# Patient Record
Sex: Male | Born: 1980
Health system: Southern US, Community
[De-identification: ages and names within clinical notes are randomized; demographics above are authoritative.]

## PROBLEM LIST (undated history)

## (undated) DIAGNOSIS — F319 Bipolar disorder, unspecified: Secondary | ICD-10-CM

---
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
## 2000-03-23 ENCOUNTER — Emergency Department (HOSPITAL_COMMUNITY): Admission: EM | Admit: 2000-03-23 | Discharge: 2000-03-23 | Payer: Self-pay

## 2000-11-02 ENCOUNTER — Emergency Department (HOSPITAL_COMMUNITY): Admission: EM | Admit: 2000-11-02 | Discharge: 2000-11-02 | Payer: Self-pay | Admitting: Emergency Medicine

## 2002-08-19 ENCOUNTER — Emergency Department (HOSPITAL_COMMUNITY): Admission: EM | Admit: 2002-08-19 | Discharge: 2002-08-19 | Payer: Self-pay | Admitting: Emergency Medicine

## 2002-08-19 ENCOUNTER — Encounter: Payer: Self-pay | Admitting: Emergency Medicine

## 2002-12-24 ENCOUNTER — Encounter: Payer: Self-pay | Admitting: Emergency Medicine

## 2002-12-24 ENCOUNTER — Emergency Department (HOSPITAL_COMMUNITY): Admission: EM | Admit: 2002-12-24 | Discharge: 2002-12-24 | Payer: Self-pay | Admitting: Emergency Medicine

## 2002-12-26 ENCOUNTER — Emergency Department (HOSPITAL_COMMUNITY): Admission: EM | Admit: 2002-12-26 | Discharge: 2002-12-26 | Payer: Self-pay | Admitting: Emergency Medicine

## 2003-10-08 ENCOUNTER — Emergency Department (HOSPITAL_COMMUNITY): Admission: EM | Admit: 2003-10-08 | Discharge: 2003-10-08 | Payer: Self-pay | Admitting: Emergency Medicine

## 2004-02-14 ENCOUNTER — Encounter: Admission: RE | Admit: 2004-02-14 | Discharge: 2004-02-14 | Payer: Self-pay | Admitting: Internal Medicine

## 2004-04-15 ENCOUNTER — Ambulatory Visit: Payer: Self-pay | Admitting: Internal Medicine

## 2004-07-17 ENCOUNTER — Emergency Department (HOSPITAL_COMMUNITY): Admission: EM | Admit: 2004-07-17 | Discharge: 2004-07-17 | Payer: Self-pay | Admitting: Emergency Medicine

## 2005-02-15 ENCOUNTER — Ambulatory Visit: Payer: Self-pay | Admitting: Psychiatry

## 2005-02-15 ENCOUNTER — Inpatient Hospital Stay (HOSPITAL_COMMUNITY): Admission: RE | Admit: 2005-02-15 | Discharge: 2005-02-17 | Payer: Self-pay | Admitting: Psychiatry

## 2005-04-08 ENCOUNTER — Emergency Department (HOSPITAL_COMMUNITY): Admission: EM | Admit: 2005-04-08 | Discharge: 2005-04-08 | Payer: Self-pay | Admitting: *Deleted

## 2005-04-14 ENCOUNTER — Emergency Department (HOSPITAL_COMMUNITY): Admission: EM | Admit: 2005-04-14 | Discharge: 2005-04-14 | Payer: Self-pay | Admitting: Emergency Medicine

## 2005-09-21 ENCOUNTER — Emergency Department (HOSPITAL_COMMUNITY): Admission: EM | Admit: 2005-09-21 | Discharge: 2005-09-21 | Payer: Self-pay | Admitting: Emergency Medicine

## 2007-11-23 ENCOUNTER — Emergency Department (HOSPITAL_COMMUNITY): Admission: EM | Admit: 2007-11-23 | Discharge: 2007-11-23 | Payer: Self-pay | Admitting: Emergency Medicine

## 2009-10-25 ENCOUNTER — Emergency Department (HOSPITAL_COMMUNITY): Admission: EM | Admit: 2009-10-25 | Discharge: 2009-10-25 | Payer: Self-pay | Admitting: Family Medicine

## 2009-11-18 ENCOUNTER — Emergency Department (HOSPITAL_COMMUNITY): Admission: EM | Admit: 2009-11-18 | Discharge: 2009-11-18 | Payer: Self-pay | Admitting: Family Medicine

## 2010-06-16 ENCOUNTER — Emergency Department (HOSPITAL_COMMUNITY)
Admission: EM | Admit: 2010-06-16 | Discharge: 2010-06-16 | Payer: Self-pay | Source: Home / Self Care | Admitting: Emergency Medicine

## 2010-07-10 ENCOUNTER — Emergency Department (HOSPITAL_BASED_OUTPATIENT_CLINIC_OR_DEPARTMENT_OTHER)
Admission: EM | Admit: 2010-07-10 | Discharge: 2010-07-10 | Payer: Self-pay | Source: Home / Self Care | Admitting: Emergency Medicine

## 2010-07-15 ENCOUNTER — Ambulatory Visit
Admission: RE | Admit: 2010-07-15 | Discharge: 2010-07-15 | Payer: Self-pay | Source: Home / Self Care | Attending: Family Medicine | Admitting: Family Medicine

## 2010-07-15 DIAGNOSIS — M542 Cervicalgia: Secondary | ICD-10-CM | POA: Insufficient documentation

## 2010-07-15 DIAGNOSIS — M549 Dorsalgia, unspecified: Secondary | ICD-10-CM | POA: Insufficient documentation

## 2010-07-29 ENCOUNTER — Inpatient Hospital Stay (INDEPENDENT_AMBULATORY_CARE_PROVIDER_SITE_OTHER)
Admission: RE | Admit: 2010-07-29 | Discharge: 2010-07-29 | Disposition: A | Discharge status: Home / Self Care | Payer: No Typology Code available for payment source | Source: Ambulatory Visit

## 2010-07-29 ENCOUNTER — Ambulatory Visit (HOSPITAL_COMMUNITY): Payer: Self-pay

## 2010-07-29 ENCOUNTER — Other Ambulatory Visit: Payer: Self-pay | Admitting: Emergency Medicine

## 2010-07-29 DIAGNOSIS — R319 Hematuria, unspecified: Secondary | ICD-10-CM

## 2010-07-30 LAB — POCT URINALYSIS DIPSTICK
Bilirubin Urine: NEGATIVE
Specific Gravity, Urine: 1.025 (ref 1.005–1.030)
pH: 7 (ref 5.0–8.0)
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
## 2010-08-14 ENCOUNTER — Ambulatory Visit: Payer: No Typology Code available for payment source | Attending: Family Medicine | Admitting: Physical Therapy

## 2010-08-14 DIAGNOSIS — M2550 Pain in unspecified joint: Secondary | ICD-10-CM | POA: Insufficient documentation

## 2010-08-14 DIAGNOSIS — M2560 Stiffness of unspecified joint, not elsewhere classified: Secondary | ICD-10-CM | POA: Insufficient documentation

## 2010-08-14 DIAGNOSIS — IMO0001 Reserved for inherently not codable concepts without codable children: Secondary | ICD-10-CM | POA: Insufficient documentation

## 2010-08-20 ENCOUNTER — Ambulatory Visit: Payer: No Typology Code available for payment source | Admitting: Physical Therapy

## 2010-08-21 ENCOUNTER — Inpatient Hospital Stay (INDEPENDENT_AMBULATORY_CARE_PROVIDER_SITE_OTHER)
Admission: RE | Admit: 2010-08-21 | Discharge: 2010-08-21 | Disposition: A | Discharge status: Home / Self Care | Payer: Self-pay | Source: Ambulatory Visit | Attending: Family Medicine | Admitting: Family Medicine

## 2010-08-21 DIAGNOSIS — R300 Dysuria: Secondary | ICD-10-CM

## 2010-08-21 LAB — POCT URINALYSIS DIPSTICK
Hgb urine dipstick: NEGATIVE
Ketones, ur: NEGATIVE mg/dL
Protein, ur: NEGATIVE mg/dL
Specific Gravity, Urine: 1.025 (ref 1.005–1.030)
pH: 6 (ref 5.0–8.0)

## 2010-08-22 LAB — GC/CHLAMYDIA PROBE AMP, GENITAL: GC Probe Amp, Genital: NEGATIVE

## 2010-08-24 ENCOUNTER — Ambulatory Visit: Payer: No Typology Code available for payment source | Admitting: Rehabilitation

## 2010-08-27 ENCOUNTER — Encounter: Payer: Self-pay | Admitting: Rehabilitation

## 2010-09-07 ENCOUNTER — Ambulatory Visit: Payer: No Typology Code available for payment source | Attending: Family Medicine | Admitting: Rehabilitation

## 2010-09-10 ENCOUNTER — Ambulatory Visit: Payer: No Typology Code available for payment source | Admitting: Physical Therapy

## 2010-09-14 LAB — GC/CHLAMYDIA PROBE AMP, GENITAL: Chlamydia, DNA Probe: NEGATIVE

## 2010-09-14 LAB — POCT URINALYSIS DIP (DEVICE)
Bilirubin Urine: NEGATIVE
Glucose, UA: NEGATIVE mg/dL
Specific Gravity, Urine: 1.025 (ref 1.005–1.030)
Urobilinogen, UA: 0.2 mg/dL (ref 0.0–1.0)

## 2010-09-15 LAB — WOUND CULTURE

## 2010-09-28 LAB — POCT I-STAT, CHEM 8
Calcium, Ion: 1.19 mmol/L (ref 1.12–1.32)
HCT: 47 % (ref 39.0–52.0)
TCO2: 25 mmol/L (ref 0–100)

## 2010-09-28 LAB — POCT URINALYSIS DIP (DEVICE)
Glucose, UA: NEGATIVE mg/dL
Nitrite: NEGATIVE
Protein, ur: NEGATIVE mg/dL
Urobilinogen, UA: 0.2 mg/dL (ref 0.0–1.0)

## 2010-09-28 LAB — GC/CHLAMYDIA PROBE AMP, GENITAL
Chlamydia, DNA Probe: NEGATIVE
GC Probe Amp, Genital: NEGATIVE
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
## 2011-03-24 LAB — URINALYSIS, ROUTINE W REFLEX MICROSCOPIC
Hgb urine dipstick: NEGATIVE
Nitrite: NEGATIVE
pH: 6

## 2011-03-24 LAB — RPR: RPR Ser Ql: NONREACTIVE

## 2011-03-31 ENCOUNTER — Inpatient Hospital Stay (INDEPENDENT_AMBULATORY_CARE_PROVIDER_SITE_OTHER)
Admission: RE | Admit: 2011-03-31 | Discharge: 2011-03-31 | Disposition: A | Discharge status: Home / Self Care | Payer: Self-pay | Source: Ambulatory Visit | Attending: Family Medicine | Admitting: Family Medicine

## 2011-03-31 DIAGNOSIS — N342 Other urethritis: Secondary | ICD-10-CM

## 2011-04-01 LAB — GC/CHLAMYDIA PROBE AMP, GENITAL: Chlamydia, DNA Probe: NEGATIVE

## 2011-04-07 ENCOUNTER — Emergency Department (HOSPITAL_BASED_OUTPATIENT_CLINIC_OR_DEPARTMENT_OTHER)
Admission: EM | Admit: 2011-04-07 | Discharge: 2011-04-07 | Disposition: A | Discharge status: Home / Self Care | Payer: Self-pay | Attending: Emergency Medicine | Admitting: Emergency Medicine

## 2011-04-07 ENCOUNTER — Emergency Department (INDEPENDENT_AMBULATORY_CARE_PROVIDER_SITE_OTHER): Payer: Self-pay

## 2011-04-07 ENCOUNTER — Encounter: Payer: Self-pay | Admitting: *Deleted

## 2011-04-07 DIAGNOSIS — M25519 Pain in unspecified shoulder: Secondary | ICD-10-CM

## 2011-04-07 DIAGNOSIS — R42 Dizziness and giddiness: Secondary | ICD-10-CM | POA: Insufficient documentation

## 2011-04-07 DIAGNOSIS — F17200 Nicotine dependence, unspecified, uncomplicated: Secondary | ICD-10-CM | POA: Insufficient documentation

## 2011-04-07 DIAGNOSIS — F319 Bipolar disorder, unspecified: Secondary | ICD-10-CM | POA: Insufficient documentation

## 2011-04-07 HISTORY — DX: Bipolar disorder, unspecified: F31.9

## 2011-04-07 LAB — CBC
Hemoglobin: 14.4 g/dL (ref 13.0–17.0)
MCH: 29.9 pg (ref 26.0–34.0)
Platelets: 222 10*3/uL (ref 150–400)
RBC: 4.82 MIL/uL (ref 4.22–5.81)
WBC: 6.7 10*3/uL (ref 4.0–10.5)

## 2011-04-07 LAB — BASIC METABOLIC PANEL
Calcium: 9.2 mg/dL (ref 8.4–10.5)
GFR calc Af Amer: >90 mL/min (ref >90)
GFR calc non Af Amer: 89 mL/min — ABNORMAL LOW (ref >90)
Glucose, Bld: 99 mg/dL (ref 70–99)
Potassium: 3.8 mEq/L (ref 3.5–5.1)
Sodium: 138 mEq/L (ref 135–145)

## 2011-04-07 MED ORDER — MECLIZINE HCL 25 MG PO TABS
25.0000 mg | ORAL_TABLET | Freq: Once | ORAL | Status: AC
  Administered 2011-04-07: 25 mg via ORAL
  Filled 2011-04-07: qty 1

## 2011-04-07 NOTE — ED Provider Notes (Signed)
History     CSN: [id number] Arrival date & time: 04/07/2011  1:36 PM  Chief Complaint  Patient presents with  . Dizziness    x 2 days    (Consider location/radiation/quality/duration/timing/severity/associated sxs/prior treatment) The history is provided by the patient.  pt indicates in past 2 days has felt dizzy at times. States is neither room spinning sensation, nor faint feeling, 'just dizzy'. No nv. No exacerbating or alleviating factors. No ear pain or tinnitus. No hearing loss. No asa use. No sinus congestion or uri symptoms. No head pain, although states last pm did have mild headache, not associated w onset dizziness. No problems w balance or coordination. No change in speech or vision. No numbness/weakness. Denies blood loss/melena. No chest pain. No palpitations. No sob. Denies abd pain. No nvd. No gu c/o. Denies any medication use.   Past Medical History  Diagnosis Date  . Bipolar 1 disorder, depressed     Past Surgical History  Procedure Date  . Mandible fracture surgery 1996    Family History  Problem Relation Age of Onset  . Cancer Mother   . Diabetes Mother   . Hypertension Mother     History  Substance Use Topics  . Smoking status: Current Some Day Smoker -- 1.0 packs/day for 10 years    Types: Cigarettes  . Smokeless tobacco: Not on file  . Alcohol Use: No      Review of Systems  Constitutional: Negative for fever and chills.  HENT: Negative for ear pain, neck pain and tinnitus.   Eyes: Negative for pain.  Respiratory: Negative for shortness of breath.   Cardiovascular: Negative for chest pain and palpitations.  Gastrointestinal: Negative for abdominal pain.  Genitourinary: Negative for dysuria.  Musculoskeletal: Negative for back pain.  Skin: Negative for rash.  Neurological: Negative for speech difficulty, weakness and numbness.  Hematological: Does not bruise/bleed easily.    Allergies  Penicillins  Home Medications  No current  outpatient prescriptions on file.  BP 119/70  Pulse 64  Temp(Src) 98.7 F (37.1 C) (Oral)  Resp 20  Ht 5\' 10"  (1.778 m)  Wt 170 lb (77.111 kg)  BMI 24.39 kg/m2  SpO2 99%  Physical Exam  Nursing note and vitals reviewed. Constitutional: He is oriented to person, place, and time. He appears well-developed and well-nourished. No distress.  HENT:  Head: Atraumatic.  Right Ear: External ear normal.  Left Ear: External ear normal.  Mouth/Throat: Oropharynx is clear and moist.  Eyes: EOM are normal. Pupils are equal, round, and reactive to light.  Neck: Normal range of motion. Neck supple. No tracheal deviation present. No thyromegaly present.       No carotid bruit  Cardiovascular: Normal rate, regular rhythm, normal heart sounds and intact distal pulses.  Exam reveals no gallop and no friction rub.   No murmur heard. Pulmonary/Chest: Effort normal and breath sounds normal. No accessory muscle usage. No respiratory distress.  Abdominal: Soft. Bowel sounds are normal. He exhibits no distension. There is no tenderness.  Musculoskeletal: Normal range of motion. He exhibits no edema and no tenderness.  Neurological: He is alert and oriented to person, place, and time.       No nystagmus. No facial droop or asymmetry. Normal/stable gait. No pronator drift.   Skin: Skin is warm and dry. No rash noted.  Psychiatric: He has a normal mood and affect.    ED Course  Procedures (including critical care time)   Labs Reviewed  BASIC METABOLIC PANEL  CBC   No results found.   No diagnosis found.    MDM  Pt also expresses concern over left elbow pain. States for  'years' has sensation of something being stuck in elbow, ?fb.  States cut/injured on broken glass many years ago. Periodically feels sharp pain to olecranon area and feels as if fb, requests xr.         Suzi Roots, MD 04/07/11 [phone]

## 2011-04-07 NOTE — ED Notes (Signed)
Patient states he woke up 2 days ago with dizziness and intermittent nausea.  Denies recent illness or injury.

## 2011-10-05 ENCOUNTER — Encounter (HOSPITAL_BASED_OUTPATIENT_CLINIC_OR_DEPARTMENT_OTHER): Payer: Self-pay | Admitting: Emergency Medicine

## 2011-10-05 ENCOUNTER — Emergency Department (HOSPITAL_BASED_OUTPATIENT_CLINIC_OR_DEPARTMENT_OTHER)
Admission: EM | Admit: 2011-10-05 | Discharge: 2011-10-05 | Disposition: A | Discharge status: Home / Self Care | Payer: Self-pay | Attending: Emergency Medicine | Admitting: Emergency Medicine

## 2011-10-05 DIAGNOSIS — F319 Bipolar disorder, unspecified: Secondary | ICD-10-CM | POA: Insufficient documentation

## 2011-10-05 DIAGNOSIS — F17200 Nicotine dependence, unspecified, uncomplicated: Secondary | ICD-10-CM | POA: Insufficient documentation

## 2011-10-05 DIAGNOSIS — W57XXXA Bitten or stung by nonvenomous insect and other nonvenomous arthropods, initial encounter: Secondary | ICD-10-CM | POA: Insufficient documentation

## 2011-10-05 DIAGNOSIS — T148 Other injury of unspecified body region: Secondary | ICD-10-CM | POA: Insufficient documentation

## 2011-10-05 NOTE — Discharge Instructions (Signed)
Bedbugs Bedbugs are tiny bugs that live in and around beds. During the day, they hide in mattresses and other places near beds. They come out at night and bite people lying in bed. They need blood to live and grow. Bedbugs can be found in beds anywhere. Usually, they are found in places where many people come and go (hotels, shelters, hospitals). It does not matter whether the place is dirty or clean. Getting bitten by bedbugs rarely causes a medical problem. The biggest problem can be getting rid of them. This often takes the work of a pest control expert. CAUSES  Less use of pesticides. Bedbugs were common before the 1950s. Then, strong pesticides such as DDT nearly wiped them out. Today, these pesticides are not used because they harm the environment and can cause health problems.   More travel. Besides mattresses, bedbugs can also live in clothing and luggage. They can come along as people travel from place to place. Bedbugs are more common in certain parts of the world. When people travel to those areas, the bugs can come home with them.   Presence of birds and bats. Bedbugs often infest birds and bats. If you have these animals in or near your home, bedbugs may infest your house, too.  SYMPTOMS It does not hurt to be bitten by a bedbug. You will probably not wake up when you are bitten. Bedbugs usually bite areas of the skin that are not covered. Symptoms may show when you wake up, or they may take a day or more to show up. Symptoms may include:  Small red bumps on the skin. These might be lined up in a row or clustered in a group.   A darker red dot in the middle of red bumps.   Blisters on the skin. There may be swelling and very bad itching. These may be signs of an allergic reaction. This does not happen often.  DIAGNOSIS Bedbug bites might look and feel like other types of insect bites. The bugs do not stay on the body like ticks or lice. They bite, drop off, and crawl away to hide.  Your caregiver will probably:  Ask about your symptoms.   Ask about your recent activities and travel.   Check your skin for bedbug bites.   Ask you to check at home for signs of bedbugs. You should look for:   Spots or stains on the bed or nearby. This could be from bedbugs that were crushed or from their eggs or waste.   Bedbugs themselves. They are reddish-brown, oval, and flat. They do not fly. They are about the size of an apple seed.   Places to look for bedbugs include:   Beds. Check mattresses, headboards, box springs, and bed frames.   On drapes and curtains near the bed.   Under carpeting in the bedroom.   Behind electrical outlets.   Behind any wallpaper that is peeling.   Inside luggage.  TREATMENT Most bedbug bites do not need treatment. They usually go away on their own in a few days. The bites are not dangerous. However, treatment may be needed if you have scratched so much that your skin has become infected. You may also need treatment if you are allergic to bedbug bites. Treatment options include:  A drug that stops swelling and itching (corticosteroid). Usually, a cream is rubbed on the skin. If you have a bad rash, you may be given a corticosteroid pill.   Oral antihistamines. These are   pills to help control itching.   Antibiotic medicines. An antibiotic may be prescribed for infected skin.  HOME CARE INSTRUCTIONS   Take any medicine prescribed by your caregiver for your bites. Follow the directions carefully.   Consider wearing pajamas with long sleeves and pant legs.   Your bedroom may need to be treated. A pest control expert should make sure the bedbugs are gone. You may need to throw away mattresses or luggage. Ask the pest control expert what you can do to keep the bedbugs from coming back. Common suggestions include:   Putting a plastic cover over your mattress.   Washing and drying your clothes and bedding in hot water and a hot dryer. The  temperature should be hotter than 120 F (48.9 C). Bedbugs are killed by high temperatures.   Vacuuming carefully all around your bed. Vacuum in all cracks and crevices where the bugs might hide. Do this often.   Carefully checking all used furniture, bedding, or clothes that you bring into your house.   Eliminating bird nests and bat roosts.   If you get bedbug bites when traveling, check all your possessions carefully before bringing them into your house. If you find any bugs on clothes or in your luggage, consider throwing those items away.  SEEK MEDICAL CARE IF:  You have red bug bites that keep coming back.   You have red bug bites that itch badly.   You have bug bites that cause a skin rash.   You have scratch marks that are red and sore.  SEEK IMMEDIATE MEDICAL CARE IF: You have a fever. Document Released: 07/17/2010 Document Revised: 06/03/2011 Document Reviewed: 07/17/2010 [facility] Patient Information 2012 [place], [place].Insect Bite Mosquitoes, flies, fleas, bedbugs, and many other insects can bite. Insect bites are different from insect stings. A sting is when venom is injected into the skin. Some insect bites can transmit infectious diseases. SYMPTOMS  Insect bites usually turn red, swell, and itch for 2 to 4 days. They often go away on their own. TREATMENT  Your caregiver may prescribe antibiotic medicines if a bacterial infection develops in the bite. HOME CARE INSTRUCTIONS  Do not scratch the bite area.   Keep the bite area clean and dry. Wash the bite area thoroughly with soap and water.   Put ice or cool compresses on the bite area.   Put ice in a plastic bag.   Place a towel between your skin and the bag.   Leave the ice on for 20 minutes, 4 times a day for the first 2 to 3 days, or as directed.   You may apply a baking soda paste, cortisone cream, or calamine lotion to the bite area as directed by your caregiver. This can help reduce itching and swelling.     Only take over-the-counter or prescription medicines as directed by your caregiver.   If you are given antibiotics, take them as directed. Finish them even if you start to feel better.  You may need a tetanus shot if:  You cannot remember when you had your last tetanus shot.   You have never had a tetanus shot.   The injury broke your skin.  If you get a tetanus shot, your arm may swell, get red, and feel warm to the touch. This is common and not a problem. If you need a tetanus shot and you choose not to have one, there is a rare chance of getting tetanus. Sickness from tetanus can be serious. SEEK  IMMEDIATE MEDICAL CARE IF:   You have increased pain, redness, or swelling in the bite area.   You see a red line on the skin coming from the bite.   You have a fever.   You have joint pain.   You have a headache or neck pain.   You have unusual weakness.   You have a rash.   You have chest pain or shortness of breath.   You have abdominal pain, nausea, or vomiting.   You feel unusually tired or sleepy.  MAKE SURE YOU:   Understand these instructions.   Will watch your condition.   Will get help right away if you are not doing well or get worse.  Document Released: 07/22/2004 Document Revised: 06/03/2011 Document Reviewed: 01/13/2011 [facility] Patient Information 2012 [place], [place].

## 2011-10-05 NOTE — ED Notes (Signed)
States woke up yesterday morning with "welts" on my stomach and back

## 2011-10-05 NOTE — ED Provider Notes (Signed)
History     CSN: [id number]  Arrival date & time 10/05/11  1338   First MD Initiated Contact with Patient 10/05/11 1427      Chief Complaint  Patient presents with  . Rash    (Consider location/radiation/quality/duration/timing/severity/associated sxs/prior treatment) Patient is a 31 y.o. male presenting with rash. The history is provided by the patient. No language interpreter was used.  Rash  This is a new problem. The current episode started 2 days ago. The problem has been gradually worsening. The problem is associated with an insect bite/sting. There has been no fever. The rash is present on the torso. The pain is at a severity of 3/10. Associated symptoms include blisters and itching. He has tried nothing for the symptoms. The treatment provided moderate relief. Risk factors: stayed in a hotel.    Past Medical History  Diagnosis Date  . Bipolar 1 disorder, depressed     Past Surgical History  Procedure Date  . Mandible fracture surgery 1996    Family History  Problem Relation Age of Onset  . Cancer Mother   . Diabetes Mother   . Hypertension Mother     History  Substance Use Topics  . Smoking status: Current Some Day Smoker -- 1.0 packs/day for 10 years    Types: Cigarettes  . Smokeless tobacco: Not on file  . Alcohol Use: No      Review of Systems  Skin: Positive for itching and rash.  All other systems reviewed and are negative.    Allergies  Penicillins  Home Medications  No current outpatient prescriptions on file.  BP 120/77  Pulse 72  Temp(Src) 98.1 F (36.7 C) (Oral)  Resp 20  Ht 5\' 10"  (1.778 m)  Wt 185 lb (83.915 kg)  BMI 26.54 kg/m2  SpO2 99%  Physical Exam  Nursing note and vitals reviewed. Constitutional: He is oriented to person, place, and time. He appears well-developed and well-nourished.  HENT:  Head: Normocephalic and atraumatic.  Eyes: Conjunctivae and EOM are normal. Pupils are equal, round, and reactive to light.    Musculoskeletal: Normal range of motion.  Neurological: He is alert and oriented to person, place, and time.  Skin: Rash noted.       Multiple red raised areas with pinpoint center looks like bites  Psychiatric: He has a normal mood and affect.    ED Course  Procedures (including critical care time)  Labs Reviewed - No data to display No results found.   1. Insect bite       MDM  I advised benadryl and topical hydrocortisone cream       Lonia Skinner [place], [place] 10/05/11 1457

## 2011-10-07 NOTE — ED Provider Notes (Signed)
Medical screening examination/treatment/procedure(s) were performed by non-physician practitioner and as supervising physician I was immediately available for consultation/collaboration.   Forbes Cellar, MD 10/07/11 1625

## 2012-03-03 ENCOUNTER — Emergency Department (HOSPITAL_BASED_OUTPATIENT_CLINIC_OR_DEPARTMENT_OTHER)
Admission: EM | Admit: 2012-03-03 | Discharge: 2012-03-03 | Disposition: A | Discharge status: Home / Self Care | Payer: Self-pay | Attending: Emergency Medicine | Admitting: Emergency Medicine

## 2012-03-03 ENCOUNTER — Encounter (HOSPITAL_BASED_OUTPATIENT_CLINIC_OR_DEPARTMENT_OTHER): Payer: Self-pay | Admitting: *Deleted

## 2012-03-03 DIAGNOSIS — T148 Other injury of unspecified body region: Secondary | ICD-10-CM | POA: Insufficient documentation

## 2012-03-03 DIAGNOSIS — L02219 Cutaneous abscess of trunk, unspecified: Secondary | ICD-10-CM | POA: Insufficient documentation

## 2012-03-03 DIAGNOSIS — L0291 Cutaneous abscess, unspecified: Secondary | ICD-10-CM

## 2012-03-03 DIAGNOSIS — W57XXXA Bitten or stung by nonvenomous insect and other nonvenomous arthropods, initial encounter: Secondary | ICD-10-CM | POA: Insufficient documentation

## 2012-03-03 MED ORDER — CLINDAMYCIN HCL 150 MG PO CAPS: 150.0000 mg | ORAL_CAPSULE | Freq: Three times a day (TID) | ORAL | Status: AC

## 2012-03-03 MED ORDER — SULFAMETHOXAZOLE-TRIMETHOPRIM 800-160 MG PO TABS: 1.0000 | ORAL_TABLET | Freq: Two times a day (BID) | ORAL | Status: AC

## 2012-03-03 NOTE — ED Notes (Signed)
PA at bedside.

## 2012-03-03 NOTE — ED Provider Notes (Signed)
History     CSN: [id number]  Arrival date & time 03/03/12  1753   First MD Initiated Contact with Patient 03/03/12 2038      Chief Complaint  Patient presents with  . Insect Bite    (Consider location/radiation/quality/duration/timing/severity/associated sxs/prior treatment) The history is provided by the patient, a parent and medical records.   Patrick [name] is a 31 y.o. male presents to the emergency department complaining of insect bites.  The onset of the symptoms was  abrupt starting 3 days ago.  The patient has associated itching, swelling, and erythema.  The symptoms have been  persistent, gradually worsened.  nothing makes the symptoms worse and nothing makes symptoms better.  The patient denies fever, chills, headache, neck pain, back pain, chest pain, shortness of breath, nausea, diarrhea, vomiting, weakness, dizziness.  Patient states he was bitten by what he thinks a spider 3 days ago. He has spots on his abdomen, his back and his right hand. He states they have continued to swell and become painful.  He states this has happened before and he was given antibiotics for the lesions.  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    Past Medical History  Diagnosis Date  . Bipolar 1 disorder, depressed     Past Surgical History  Procedure Date  . Mandible fracture surgery 1996    Family History  Problem Relation Age of Onset  . Cancer Mother   . Diabetes Mother   . Hypertension Mother     History  Substance Use Topics  . Smoking status: Current Some Day Smoker -- 1.0 packs/day for 10 years    Types: Cigarettes  . Smokeless tobacco: Not on file  . Alcohol Use: No      Review of Systems  Constitutional: Negative for fever.  Gastrointestinal: Negative for nausea and vomiting.  Skin: Positive for rash.    Allergies  Penicillins  Home Medications   Current Outpatient Rx  Name Route Sig Dispense Refill  . CLINDAMYCIN HCL 150 MG PO  CAPS Oral Take 1 capsule (150 mg total) by mouth 3 (three) times daily. 28 capsule 0  . SULFAMETHOXAZOLE-TRIMETHOPRIM 800-160 MG PO TABS Oral Take 1 tablet by mouth every 12 (twelve) hours. 20 tablet 0    BP 122/73  Pulse 88  Temp 98.1 F (36.7 C) (Oral)  Resp 16  Ht 5\' 11"  (1.803 m)  Wt 170 lb (77.111 kg)  BMI 23.71 kg/m2  SpO2 100%  Physical Exam  Nursing note and vitals reviewed. Constitutional: He appears well-developed and well-nourished. No distress.  HENT:  Head: Normocephalic and atraumatic.  Eyes: Conjunctivae are normal. No scleral icterus.  Neck: Normal range of motion.  Cardiovascular: Normal rate, regular rhythm, normal heart sounds and intact distal pulses.  Exam reveals no gallop and no friction rub.   No murmur heard. Pulmonary/Chest: Effort normal and breath sounds normal. No respiratory distress. He has no wheezes. He has no rales.  Abdominal: Soft. Bowel sounds are normal. He exhibits no distension. There is no tenderness. There is no rebound.  Neurological: He is alert.  Skin: Skin is warm and dry. Rash:  abscess. He is not diaphoretic. There is erythema.          Area of erythema, edema, swelling on the lower left abdomen in the upper right back. Smaller area on the medial aspect of the right hand.    ED Course  Procedures (including critical care time)  Labs Reviewed - No data to display No results found.   1. Insect bite   2. Abscess       MDM  Patrick [name] presents with insect bites to the abdomen, back and hands.  Clear bites in the center of the infected area, though neither site is draining.  No Induration of the skin or evidence of cellulitis.  We'll discharge home with Bactrim and clindamycin for MRSA coverage.  Schedule patient the need to return if he develops fever or other systemic symptoms. He should also return if area of erythema extends.  Discussion of use of warm compresses.  I have also discussed reasons to return immediately to  the ER.  Patient expresses understanding and agrees with plan.  1. Medications: Bactrim, clindamycin 2. Treatment: Take medications as directed, use warm compresses 3. Follow Up: Primary care physician next week. ON the emergency department if symptoms worsen.   You may return to the emergency department if you have  a fever that persists greater than 101 or your abscess appears to become infected (growing surrounding redness and warmth). Do not operate any heavy machinery while on pain medications. Do not consume alcohol on these medications either.  Abscess An abscess (boil or furuncle) is an infected area that contains a collection of pus.  SYMPTOMS Signs and symptoms of an abscess include pain, tenderness, redness, or hardness. You may feel a moveable soft area under your skin. An abscess can occur anywhere in the body.  TREATMENT  A surgical cut (incision) may be made over your abscess to drain the pus. Gauze may be packed into the space or a drain may be looped through the abscess cavity (pocket). This provides a drain that will allow the cavity to heal from the inside outwards. The abscess may be painful for a few days, but should feel much better if it was drained.  Your abscess, if seen early, may not have localized and may not have been drained. If not, another appointment may be required if it does not get better on its own or with medications. HOME CARE INSTRUCTIONS   Only take over-the-counter or prescription medicines for pain, discomfort, or fever as directed by your caregiver.   Take your antibiotics as directed if they were prescribed. Finish them even if you start to feel better.   Keep the skin and clothes clean around your abscess.   If the abscess was drained, you will need to use gauze dressing to collect any draining pus. Dressings will typically need to be changed 3 or more times a day.   The infection may spread by skin contact with others. Avoid skin contact as much  as possible.   Practice good hygiene. This includes regular hand washing, cover any draining skin lesions, and do not share personal care items.   If you participate in sports, do not share athletic equipment, towels, whirlpools, or personal care items. Shower after every practice or tournament.   If a draining area cannot be adequately covered:   Do not participate in sports.   Children should not participate in day care until the wound has healed or drainage stops.   If your caregiver has given you a follow-up appointment, it is very important to keep that appointment. Not keeping the appointment could result in a much worse infection, chronic or permanent injury, pain, and disability. If there is any problem keeping the appointment, you must call back to this facility for assistance.  SEEK MEDICAL CARE  IF:   You develop increased pain, swelling, redness, drainage, or bleeding in the wound site.   You develop signs of generalized infection including muscle aches, chills, fever, or a general ill feeling.   You have an oral temperature above 102 F (38.9 C).  MAKE SURE YOU:   Understand these instructions.   Will watch your condition.   Will get help right away if you are not doing well or get worse.  Document Released: 03/24/2005 Document Revised: 02/24/2011 Document Reviewed: 01/16/2008 [facility] Patient Information 2012 [place], [place].           Dahlia Client Jakhari Space, PA-C 03/03/12 2149

## 2012-03-03 NOTE — ED Notes (Signed)
Pt c/o insect bite x 3 days ago to abd

## 2012-03-05 NOTE — ED Provider Notes (Signed)
Medical screening examination/treatment/procedure(s) were performed by non-physician practitioner and as supervising physician I was immediately available for consultation/collaboration.   Gavin Pound. Oletta Lamas, MD 03/05/12 4098

## 2013-11-05 ENCOUNTER — Emergency Department (HOSPITAL_BASED_OUTPATIENT_CLINIC_OR_DEPARTMENT_OTHER)
Admission: EM | Admit: 2013-11-05 | Discharge: 2013-11-05 | Discharge status: Against Medical Advice | Payer: Self-pay | Attending: Emergency Medicine | Admitting: Emergency Medicine

## 2013-11-05 ENCOUNTER — Emergency Department (HOSPITAL_BASED_OUTPATIENT_CLINIC_OR_DEPARTMENT_OTHER): Payer: Self-pay

## 2013-11-05 ENCOUNTER — Encounter (HOSPITAL_BASED_OUTPATIENT_CLINIC_OR_DEPARTMENT_OTHER): Payer: Self-pay | Admitting: Emergency Medicine

## 2013-11-05 DIAGNOSIS — F17200 Nicotine dependence, unspecified, uncomplicated: Secondary | ICD-10-CM | POA: Insufficient documentation

## 2013-11-05 DIAGNOSIS — M25469 Effusion, unspecified knee: Secondary | ICD-10-CM | POA: Insufficient documentation

## 2013-11-05 DIAGNOSIS — Z8659 Personal history of other mental and behavioral disorders: Secondary | ICD-10-CM | POA: Insufficient documentation

## 2013-11-05 DIAGNOSIS — Z880 Allergy status to penicillin: Secondary | ICD-10-CM | POA: Insufficient documentation

## 2013-11-05 DIAGNOSIS — M25461 Effusion, right knee: Secondary | ICD-10-CM

## 2013-11-05 MED ORDER — IBUPROFEN 800 MG PO TABS: 800.0000 mg | ORAL_TABLET | Freq: Three times a day (TID) | ORAL | Status: DC

## 2013-11-05 MED ORDER — HYDROCODONE-ACETAMINOPHEN 5-325 MG PO TABS: 2.0000 | ORAL_TABLET | ORAL | Status: DC | PRN

## 2013-11-05 NOTE — ED Notes (Signed)
Pt reports hx of torn ACL, states he has had to have fluid removed off right knee following this numerous times.  Several days ago was playing basketball and felt a 'pop'.  Ambulatory.

## 2013-11-05 NOTE — ED Provider Notes (Signed)
CSN: [id number]     Arrival date & time 11/05/13  1253 History   First MD Initiated Contact with Patient 11/05/13 1514     Chief Complaint  Patient presents with  . Knee Pain     (Consider location/radiation/quality/duration/timing/severity/associated sxs/prior Treatment) Patient is a 33 y.o. male presenting with knee pain. The history is provided by the patient. No language interpreter was used.  Knee Pain Location:  Knee Time since incident:  1 week Injury: yes   Knee location:  R knee Pain details:    Radiates to:  Does not radiate   Severity:  Moderate   Onset quality:  Gradual   Duration:  1 week   Timing:  Constant   Progression:  Worsening Chronicity:  New Dislocation: no   Prior injury to area:  Yes Relieved by:  Nothing Pt ask to have fluid removed  Past Medical History  Diagnosis Date  . Bipolar 1 disorder, depressed    Past Surgical History  Procedure Laterality Date  . Mandible fracture surgery  1996   Family History  Problem Relation Age of Onset  . Cancer Mother   . Diabetes Mother   . Hypertension Mother    History  Substance Use Topics  . Smoking status: Current Some Day Smoker -- 1.00 packs/day for 10 years    Types: Cigarettes  . Smokeless tobacco: Not on file  . Alcohol Use: No    Review of Systems  All other systems reviewed and are negative.     Allergies  Penicillins  Home Medications   Prior to Admission medications   Medication Sig Start Date End Date Taking? Authorizing Provider  HYDROcodone-acetaminophen (NORCO/VICODIN) 5-325 MG per tablet Take 2 tablets by mouth every 4 (four) hours as needed. 11/05/13   Elson AreasLeslie K Sofia, PA-C  ibuprofen (ADVIL,MOTRIN) 800 MG tablet Take 1 tablet (800 mg total) by mouth 3 (three) times daily. 11/05/13   Elson AreasLeslie K Sofia, PA-C   BP 128/70  Pulse 60  Temp(Src) 98.7 F (37.1 C) (Oral)  Resp 16  Ht 5\' 10"  (1.778 m)  Wt 170 lb (77.111 kg)  BMI 24.39 kg/m2  SpO2 98% Physical Exam  Nursing  note and vitals reviewed. Constitutional: He is oriented to person, place, and time. He appears well-developed and well-nourished.  HENT:  Head: Normocephalic.  Musculoskeletal: He exhibits tenderness.  Large effusion,   No erythema,   Neurological: He is alert and oriented to person, place, and time. He has normal reflexes.  Skin: Skin is warm.  Psychiatric: He has a normal mood and affect.    ED Course  Procedures (including critical care time) Labs Review Labs Reviewed - No data to display  Imaging Review Dg Knee Complete 4 Views Right  11/05/2013   CLINICAL DATA:  Status post fall now with right knee pain; history of previous knee injury  EXAM: RIGHT KNEE - COMPLETE 4+ VIEW  COMPARISON:  DG KNEE COMPLETE 4 VIEWS*R* dated 09/21/2005  FINDINGS: Four views of the right knee reveal the bones to be adequately mineralized. There is no evidence of an acute fracture nor dislocation. Since the previous study sclerosis of the lateral femoral condyles has developed. This is likely related to the patient's previous femoral condyle fracture. A definite acute refracture is not demonstrated. The medial femoral condyle as well as the medial and lateral tibial plateau appear intact. There is a moderate-sized joint effusion. The proximal fibula appears intact.  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 This may be related to the history of previous lateral femoral condylar fracture but acute refracture is not absolutely excluded. There is a moderate-sized joint effusion. If the patient's symptoms warrant further evaluation, CT scanning or MRI would be useful.   Electronically Signed   By: David  [place]azil[place]   On: 11/05/2013 13:28     EKG Interpretation None      MDM I counseled pt.  I advised him of chronic arthritic changes and that knee aspiration is not indicated.     Final diagnoses:  Knee effusion, right   Pt referred to Dr. Lajoyce Cornersuda.  Pt reports he has a known  acl tear. Knee imbolizer Ibuprofen Hydrocodone    Elson AreasLeslie K Sofia, PA-C 11/05/13 1534  Lonia SkinnerLeslie K Willow CreekSofia, [place] JerseyPA-C 11/05/13 1535

## 2013-11-05 NOTE — Discharge Instructions (Signed)
Knee Effusion  Knee effusion means you have fluid in your knee. The knee may be more difficult to bend and move. HOME CARE  Use crutches or a brace as told by your doctor.  Put ice on the injured area.  Put ice in a plastic bag.  Place a towel between your skin and the bag.  Leave the ice on for 15-20 minutes, 03-04 times a day.  Raise (elevate) your knee as much as possible.  Only take medicine as told by your doctor.  You may need to do strengthening exercises. Ask your doctor.  Continue with your normal diet and activities as told by your doctor. GET HELP RIGHT AWAY IF:  You have more puffiness (swelling) in your knee.  You see redness, puffiness, or have more pain in your knee.  You have a temperature by mouth above 102 F (38.9 C).  You get a rash.  You have trouble breathing.  You have a reaction to any medicine you are taking.  You have a lot of pain when you move your knee. MAKE SURE YOU:  Understand these instructions.  Will watch your condition.  Will get help right away if you are not doing well or get worse. Document Released: 07/17/2010 Document Revised: 09/06/2011 Document Reviewed: 07/17/2010 [employer] Patient Information 2014 [employer].  

## 2013-11-05 NOTE — ED Notes (Signed)
Pt was not in the room upon entering to discharge. PA states he was upset at her not withdrawing fluid from his knee.

## 2013-11-06 NOTE — ED Provider Notes (Signed)
Medical screening examination/treatment/procedure(s) were performed by non-physician practitioner and as supervising physician I was immediately available for consultation/collaboration.   EKG Interpretation None        Gwyneth SproutWhitney Keion Neels, MD 11/06/13 [phone]

## 2014-04-15 ENCOUNTER — Other Ambulatory Visit (HOSPITAL_COMMUNITY)
Admission: RE | Admit: 2014-04-15 | Discharge: 2014-04-15 | Disposition: A | Discharge status: Home / Self Care | Payer: No Typology Code available for payment source | Source: Ambulatory Visit | Attending: Family Medicine | Admitting: Family Medicine

## 2014-04-15 ENCOUNTER — Encounter (HOSPITAL_COMMUNITY): Payer: Self-pay | Admitting: Emergency Medicine

## 2014-04-15 ENCOUNTER — Emergency Department (INDEPENDENT_AMBULATORY_CARE_PROVIDER_SITE_OTHER)
Admission: EM | Admit: 2014-04-15 | Discharge: 2014-04-15 | Disposition: A | Discharge status: Home / Self Care | Payer: Self-pay | Source: Home / Self Care | Attending: Family Medicine | Admitting: Family Medicine

## 2014-04-15 DIAGNOSIS — Z113 Encounter for screening for infections with a predominantly sexual mode of transmission: Secondary | ICD-10-CM | POA: Insufficient documentation

## 2014-04-15 DIAGNOSIS — Z202 Contact with and (suspected) exposure to infections with a predominantly sexual mode of transmission: Secondary | ICD-10-CM

## 2014-04-15 DIAGNOSIS — J40 Bronchitis, not specified as acute or chronic: Secondary | ICD-10-CM

## 2014-04-15 MED ORDER — TRAMADOL HCL 50 MG PO TABS: 50.0000 mg | ORAL_TABLET | Freq: Every evening | ORAL | Status: DC | PRN

## 2014-04-15 MED ORDER — IPRATROPIUM-ALBUTEROL 0.5-2.5 (3) MG/3ML IN SOLN
RESPIRATORY_TRACT | Status: AC
  Filled 2014-04-15: qty 3

## 2014-04-15 MED ORDER — PREDNISONE 50 MG PO TABS: 50.0000 mg | ORAL_TABLET | Freq: Every day | ORAL | Status: DC

## 2014-04-15 MED ORDER — IPRATROPIUM-ALBUTEROL 0.5-2.5 (3) MG/3ML IN SOLN
3.0000 mL | Freq: Once | RESPIRATORY_TRACT | Status: AC
  Administered 2014-04-15: 3 mL via RESPIRATORY_TRACT

## 2014-04-15 NOTE — Discharge Instructions (Signed)
Thank you for coming in today. Take prednisone daily for 5 days.  Use tramadol for cough as needed. Do not drive after taking this medicine.  I will call if any tests are positive.  Stop Smoking.  Call or go to the emergency room if you get worse, have trouble breathing, have chest pains, or palpitations.   Acute Bronchitis Bronchitis is inflammation of the airways that extend from the windpipe into the lungs (bronchi). The inflammation often causes mucus to develop. This leads to a cough, which is the most common symptom of bronchitis.  In acute bronchitis, the condition usually develops suddenly and goes away over time, usually in a couple weeks. Smoking, allergies, and asthma can make bronchitis worse. Repeated episodes of bronchitis may cause further lung problems.  CAUSES Acute bronchitis is most often caused by the same virus that causes a cold. The virus can spread from person to person (contagious) through coughing, sneezing, and touching contaminated objects. SIGNS AND SYMPTOMS   Cough.   Fever.   Coughing up mucus.   Body aches.   Chest congestion.   Chills.   Shortness of breath.   Sore throat.  DIAGNOSIS  Acute bronchitis is usually diagnosed through a physical exam. Your health care provider will also ask you questions about your medical history. Tests, such as chest X-rays, are sometimes done to rule out other conditions.  TREATMENT  Acute bronchitis usually goes away in a couple weeks. Oftentimes, no medical treatment is necessary. Medicines are sometimes given for relief of fever or cough. Antibiotic medicines are usually not needed but may be prescribed in certain situations. In some cases, an inhaler may be recommended to help reduce shortness of breath and control the cough. A cool mist vaporizer may also be used to help thin bronchial secretions and make it easier to clear the chest.  HOME CARE INSTRUCTIONS  Get plenty of rest.   Drink enough fluids  to keep your urine clear or pale yellow (unless you have a medical condition that requires fluid restriction). Increasing fluids may help thin your respiratory secretions (sputum) and reduce chest congestion, and it will prevent dehydration.   Take medicines only as directed by your health care provider.  If you were prescribed an antibiotic medicine, finish it all even if you start to feel better.  Avoid smoking and secondhand smoke. Exposure to cigarette smoke or irritating chemicals will make bronchitis worse. If you are a smoker, consider using nicotine gum or skin patches to help control withdrawal symptoms. Quitting smoking will help your lungs heal faster.   Reduce the chances of another bout of acute bronchitis by washing your hands frequently, avoiding people with cold symptoms, and trying not to touch your hands to your mouth, nose, or eyes.   Keep all follow-up visits as directed by your health care provider.  SEEK MEDICAL CARE IF: Your symptoms do not improve after 1 week of treatment.  SEEK IMMEDIATE MEDICAL CARE IF:  You develop an increased fever or chills.   You have chest pain.   You have severe shortness of breath.  You have bloody sputum.   You develop dehydration.  You faint or repeatedly feel like you are going to pass out.  You develop repeated vomiting.  You develop a severe headache. MAKE SURE YOU:   Understand these instructions.  Will watch your condition.  Will get help right away if you are not doing well or get worse. Document Released: 07/22/2004 Document Revised: 10/29/2013 Document Reviewed:  12/05/2012 Exit[employer] Patient Information 2015 [place] [place]. This information is not intended to replace advice given to you by your health care provider. Make sure you discuss any questions you have with your health care provider.

## 2014-04-15 NOTE — ED Notes (Signed)
Pt is here today because he has had a cough and cold symptoms x 2 days, pt reports productive cough

## 2014-04-15 NOTE — ED Provider Notes (Signed)
Patrick [name] is a 33 y.o. male who presents to Urgent Care today for cough. Patient has a two-day history of cough associated with congestion. The cough is mildly productive. Patient additionally notes mild shortness of breath and chest tightness. He denies any fevers or chills nausea vomiting or diarrhea. No chest pain.  Patient additionally notes mild irritation of the penis. This is been present for several weeks. He denies any discharge. He does note that he has had unprotected sex. His last STD testing was in March of 2015.   Past Medical History  Diagnosis Date  . Bipolar 1 disorder, depressed    History  Substance Use Topics  . Smoking status: Current Every Day Smoker -- 1.00 packs/day for 10 years    Types: Cigarettes  . Smokeless tobacco: Not on file  . Alcohol Use: No   ROS as above Medications: No current facility-administered medications for this encounter.   Current Outpatient Prescriptions  Medication Sig Dispense Refill  . predniSONE (DELTASONE) 50 MG tablet Take 1 tablet (50 mg total) by mouth daily.  5 tablet  0  . traMADol (ULTRAM) 50 MG tablet Take 1 tablet (50 mg total) by mouth at bedtime as needed (cough).  10 tablet  0    Exam:  BP 121/82  Pulse 64  Temp(Src) 98.2 F (36.8 C) (Oral)  SpO2 97% Gen: Well NAD HEENT: EOMI,  MMM normal posterior pharynx and tympanic membranes. Lungs: Normal work of breathing. CTABL Heart: RRR no MRG Abd: NABS, Soft. Nondistended, Nontender Exts: Brisk capillary refill, warm and well perfused.  Genitals: No inguinal lymphadenopathy. Normal-appearing penis without significant discharge.  Patient was given a 2.5/0.5 mg DuoNeb nebulizer treatment, and felt better  No results found for this or any previous visit (from the past 24 hour(s)). No results found.  Assessment and Plan: 33 y.o. male with  1) bronchitis. Likely viral. Treatment with prednisone and tramadol for cough. 2) STD test: Urine cytology and serology for  HIV and syphilis pending  Discussed warning signs or symptoms. Please see discharge instructions. Patient expresses understanding.     Rodolph BongEvan S Christle Nolting, MD 04/15/14 1027

## 2014-04-16 LAB — HIV ANTIBODY (ROUTINE TESTING W REFLEX): HIV 1&2 Ab, 4th Generation: NONREACTIVE

## 2014-04-16 LAB — RPR

## 2014-04-16 LAB — URINE CYTOLOGY ANCILLARY ONLY
Chlamydia: NEGATIVE
Neisseria Gonorrhea: NEGATIVE
Trichomonas: NEGATIVE

## 2014-04-20 ENCOUNTER — Encounter (HOSPITAL_COMMUNITY): Payer: Self-pay | Admitting: Emergency Medicine

## 2014-04-20 ENCOUNTER — Emergency Department (HOSPITAL_COMMUNITY)
Admission: EM | Admit: 2014-04-20 | Discharge: 2014-04-21 | Disposition: A | Discharge status: Other Acute Inpatient Hospital | Payer: Self-pay | Attending: Emergency Medicine | Admitting: Emergency Medicine

## 2014-04-20 DIAGNOSIS — Z880 Allergy status to penicillin: Secondary | ICD-10-CM | POA: Insufficient documentation

## 2014-04-20 DIAGNOSIS — Z720 Tobacco use: Secondary | ICD-10-CM | POA: Insufficient documentation

## 2014-04-20 DIAGNOSIS — F1410 Cocaine abuse, uncomplicated: Secondary | ICD-10-CM | POA: Insufficient documentation

## 2014-04-20 DIAGNOSIS — F1910 Other psychoactive substance abuse, uncomplicated: Secondary | ICD-10-CM

## 2014-04-20 DIAGNOSIS — F1210 Cannabis abuse, uncomplicated: Secondary | ICD-10-CM | POA: Insufficient documentation

## 2014-04-20 DIAGNOSIS — F32A Depression, unspecified: Secondary | ICD-10-CM

## 2014-04-20 DIAGNOSIS — F329 Major depressive disorder, single episode, unspecified: Secondary | ICD-10-CM | POA: Insufficient documentation

## 2014-04-20 LAB — RAPID URINE DRUG SCREEN, HOSP PERFORMED
Amphetamines: NOT DETECTED
BARBITURATES: NOT DETECTED
Benzodiazepines: NOT DETECTED
COCAINE: POSITIVE — AB
Opiates: NOT DETECTED
Tetrahydrocannabinol: POSITIVE — AB

## 2014-04-20 LAB — CBC WITH DIFFERENTIAL/PLATELET
Basophils Absolute: 0 10*3/uL (ref 0.0–0.1)
Basophils Relative: 0 % (ref 0–1)
EOS PCT: 0 % (ref 0–5)
Eosinophils Absolute: 0 10*3/uL (ref 0.0–0.7)
HEMATOCRIT: 42.5 % (ref 39.0–52.0)
HEMOGLOBIN: 14.7 g/dL (ref 13.0–17.0)
LYMPHS ABS: 3.1 10*3/uL (ref 0.7–4.0)
LYMPHS PCT: 33 % (ref 12–46)
MCH: 29.2 pg (ref 26.0–34.0)
MCHC: 34.6 g/dL (ref 30.0–36.0)
MCV: 84.3 fL (ref 78.0–100.0)
MONO ABS: 0.7 10*3/uL (ref 0.1–1.0)
Monocytes Relative: 8 % (ref 3–12)
Neutro Abs: 5.6 10*3/uL (ref 1.7–7.7)
Neutrophils Relative %: 59 % (ref 43–77)
Platelets: 239 10*3/uL (ref 150–400)
RBC: 5.04 MIL/uL (ref 4.22–5.81)
RDW: 12.9 % (ref 11.5–15.5)
WBC: 9.4 10*3/uL (ref 4.0–10.5)

## 2014-04-20 LAB — COMPREHENSIVE METABOLIC PANEL
ALT: 21 U/L (ref 0–53)
AST: 22 U/L (ref 0–37)
Albumin: 3.9 g/dL (ref 3.5–5.2)
Alkaline Phosphatase: 45 U/L (ref 39–117)
Anion gap: 12 (ref 5–15)
BUN: 11 mg/dL (ref 6–23)
CALCIUM: 9.2 mg/dL (ref 8.4–10.5)
CO2: 25 meq/L (ref 19–32)
Chloride: 100 mEq/L (ref 96–112)
Creatinine, Ser: 1.09 mg/dL (ref 0.50–1.35)
GFR, EST NON AFRICAN AMERICAN: 88 mL/min — AB (ref >90)
GLUCOSE: 98 mg/dL (ref 70–99)
Potassium: 3.6 mEq/L — ABNORMAL LOW (ref 3.7–5.3)
SODIUM: 137 meq/L (ref 137–147)
Total Bilirubin: 0.5 mg/dL (ref 0.3–1.2)
Total Protein: 7.4 g/dL (ref 6.0–8.3)

## 2014-04-20 LAB — ETHANOL

## 2014-04-20 MED ORDER — ALUM & MAG HYDROXIDE-SIMETH 200-200-20 MG/5ML PO SUSP: 30.0000 mL | ORAL | Status: DC | PRN

## 2014-04-20 MED ORDER — LORAZEPAM 1 MG PO TABS: 1.0000 mg | ORAL_TABLET | Freq: Three times a day (TID) | ORAL | Status: DC | PRN

## 2014-04-20 MED ORDER — LORAZEPAM 1 MG PO TABS: 0.0000 mg | ORAL_TABLET | Freq: Two times a day (BID) | ORAL | Status: DC

## 2014-04-20 MED ORDER — IBUPROFEN 400 MG PO TABS: 600.0000 mg | ORAL_TABLET | Freq: Three times a day (TID) | ORAL | Status: DC | PRN

## 2014-04-20 MED ORDER — ONDANSETRON HCL 4 MG PO TABS: 4.0000 mg | ORAL_TABLET | Freq: Three times a day (TID) | ORAL | Status: DC | PRN

## 2014-04-20 MED ORDER — ACETAMINOPHEN 325 MG PO TABS: 650.0000 mg | ORAL_TABLET | ORAL | Status: DC | PRN

## 2014-04-20 MED ORDER — LORAZEPAM 1 MG PO TABS: 0.0000 mg | ORAL_TABLET | Freq: Four times a day (QID) | ORAL | Status: DC

## 2014-04-20 MED ORDER — ZOLPIDEM TARTRATE 5 MG PO TABS: 5.0000 mg | ORAL_TABLET | Freq: Every evening | ORAL | Status: DC | PRN

## 2014-04-20 NOTE — Progress Notes (Signed)
[facility] contacted the following psychiatric facilities for inpatient treatment:  FAXED REFERRALS 1)[facility] 2)[facility] 3)[facility]  [facility] 1)[facility] 2)[facility] 3)[facility] 4)[facility] 5)[facility] 6)[facility] 7)[facility] 8)[facility] 9)[facility] 10)[facility]  Lavance Beazer L Ronnell Clinger, MHT/NS

## 2014-04-20 NOTE — ED Provider Notes (Signed)
CSN: [id number]     Arrival date & time 04/20/14  1457 History   First MD Initiated Contact with Patient 04/20/14 1512     Chief Complaint  Patient presents with  . Depression     (Consider location/radiation/quality/duration/timing/severity/associated sxs/prior Treatment) The history is provided by the patient.  patient presents with depression. He is a small history of depression, since he was 33 years old. States he's been off his medications for 7 or 8 years. He has some passive suicidal thoughts but no active plan. States he would not do it because he has kids. He also has cocaine abuse and alcohol abuse. States that cocaine is more frequent, but the alcohol tends to be in binges. He thinks he needs inpatient treatment. He does not have a psychiatrist.   Past Medical History  Diagnosis Date  . Bipolar 1 disorder, depressed    Past Surgical History  Procedure Laterality Date  . Mandible fracture surgery  1996   Family History  Problem Relation Age of Onset  . Cancer Mother   . Diabetes Mother   . Hypertension Mother    History  Substance Use Topics  . Smoking status: Current Every Day Smoker -- 1.00 packs/day for 10 years    Types: Cigarettes  . Smokeless tobacco: Not on file  . Alcohol Use: No    Review of Systems  Constitutional: Negative for activity change and appetite change.  Eyes: Negative for pain.  Respiratory: Negative for chest tightness and shortness of breath.   Cardiovascular: Negative for chest pain and leg swelling.  Gastrointestinal: Negative for nausea, vomiting, abdominal pain and diarrhea.  Genitourinary: Negative for flank pain.  Musculoskeletal: Negative for back pain and neck stiffness.  Skin: Negative for rash.  Neurological: Negative for weakness, numbness and headaches.  Psychiatric/Behavioral: Positive for suicidal ideas and dysphoric mood. Negative for behavioral problems.      Allergies  Penicillins  Home Medications   Prior to  Admission medications   Medication Sig Start Date End Date Taking? Authorizing Provider  predniSONE (DELTASONE) 50 MG tablet Take 1 tablet (50 mg total) by mouth daily. 04/15/14   Rodolph BongEvan S Corey, MD  traMADol (ULTRAM) 50 MG tablet Take 1 tablet (50 mg total) by mouth at bedtime as needed (cough). 04/15/14   Rodolph BongEvan S Corey, MD   BP 130/85  Pulse 73  Temp(Src) 98.4 F (36.9 C) (Oral)  Resp 15  SpO2 99% Physical Exam  Nursing note and vitals reviewed. Constitutional: He is oriented to person, place, and time. He appears well-developed and well-nourished.  HENT:  Head: Normocephalic and atraumatic.  Eyes: EOM are normal. Pupils are equal, round, and reactive to light.  Neck: Normal range of motion. Neck supple.  Cardiovascular: Normal rate, regular rhythm and normal heart sounds.   No murmur heard. Pulmonary/Chest: Effort normal and breath sounds normal.  Abdominal: Soft. Bowel sounds are normal. He exhibits no distension and no mass. There is no tenderness. There is no rebound and no guarding.  Musculoskeletal: Normal range of motion. He exhibits no edema.  Neurological: He is alert and oriented to person, place, and time. No cranial nerve deficit.  Skin: Skin is warm and dry.  Psychiatric:  Patient appears depressed    ED Course  Procedures (including critical care time) Labs Review Labs Reviewed  COMPREHENSIVE METABOLIC PANEL - Abnormal; Notable for the following:    Potassium 3.6 (*)    GFR calc non Af Amer 88 (*)    All other components  within normal limits  URINE RAPID DRUG SCREEN (HOSP PERFORMED) - Abnormal; Notable for the following:    Cocaine POSITIVE (*)    Tetrahydrocannabinol POSITIVE (*)    All other components within normal limits  CBC WITH DIFFERENTIAL  ETHANOL    Imaging Review No results found.   EKG Interpretation None      MDM   Final diagnoses:  Depression  Substance abuse    Patient with depression and suicidal thoughts. Also some's abuse.  Appears to have some suicidal thoughts but no active plan. At this point he is voluntary and will likely not need involuntary commitment. Appears to be medically cleared. Transferred to [place] to be seen by TTS    [facility] Express[facility] R. Rubin PayorPickering, MD 04/20/14 [phone]

## 2014-04-20 NOTE — ED Notes (Signed)
Pt here for depression and also requesting detox from cocaine, marijuana, and alcohol. Pt reports that he does not drink alcohol every day, but when he does, he binge drinks. Pt reports last use of all 3 yesterday. Pt is sad, but calm and cooperative. Pt requesting to see family before being moved to [facility]. Pt has been asked to provide urine, change into wine colored scrubs, and security has been contacted to wand patient. Pt is expressing thoughts of harming self, without a plan. Staffing notified of need for sitter. Family at bedside at this time.

## 2014-04-20 NOTE — ED Notes (Signed)
Jonny RuizSPOUSE - ELICHA [phone]- [phone]

## 2014-04-20 NOTE — ED Notes (Signed)
Pt in c/o increased depression, states he is going through a lot and needs to get back on medication, last had depression medication 7-8 years ago, denies SI/HI, reports ETOH and mariajuana use, also cocaine, last used yesterday, states he doesn't drink everyday but when he drinks it is binge drinking, alert and oriented, calm and cooperative

## 2014-04-20 NOTE — ED Notes (Signed)
Pt wanded by security, sitter at bedside. Family advised of visitation policy.

## 2014-04-20 NOTE — BH Assessment (Signed)
Tele Assessment Note   Patrick [name] is an 33 y.o. male [place] [facility] with his mother and with 4 of his children. [facility] for ETOH detox, cocaine/marijuana abuse, depression, and passive SI/without plan. Pt is able to contract for safety. Pt denies HI/AVH. Pt reports that he had not been taking mental health medications or seeing MH provider for past 8 years. Pt states that he has prior diagnosis of MDD. Pt reports long history of substance abuse beginning at age 33 (alcohol, cocaine, and marijuana). Last period of sobriety was May 2015-September 2015. Pt reports that multiple stressors contributed to relapse (working long hours, financially responsible for 4 of his kids). Pt reports no medical problems and states that he has a supportive family that includes his mother, fiance, and six kids.   Pt presented with depressed mood and flat/lethargic affect during assessment. He fell asleep during assessment and had to be awakened by family member. Pt has been placed on Ativan taper and reports no current withdrawal symptoms. Pt requesting inpatient treatment to address substance abuse problem and medication management for depressive symptoms. Pt pleasant and calm during assessment.   CSW ran pt by Dahlia ByesJosephine Onuoha, NP, who agreed with recommendation that pt meets criteria for inpatient treatment. [facility] TTS seeking placement. CSW informed Becky, RN of pt disposition.    Axis I: Alcohol Abuse, Major Depression, Recurrent severe and Substance Abuse Axis II: Deferred Axis III:  Past Medical History  Diagnosis Date  . Bipolar 1 disorder, depressed    Axis IV: economic problems, other psychosocial or environmental problems and problems with access to health care services Axis V: 41-50 serious symptoms  Past Medical History:  Past Medical History  Diagnosis Date  . Bipolar 1 disorder, depressed      Past Surgical History  Procedure Laterality Date  . Mandible fracture surgery  1996    Family History:  Family History  Problem Relation Age of Onset  . Cancer Mother   . Diabetes Mother   . Hypertension Mother     Social History:  reports that he has been smoking Cigarettes.  He has a 10 pack-year smoking history. He does not have any smokeless tobacco history on file. He reports that he does not drink alcohol or use illicit drugs.  Additional Social History:  Alcohol / Drug Use Pain Medications: See MAR Prescriptions: see MAR History of alcohol / drug use?: Yes Longest period of sobriety (when/how long): 4 months (may 2015-sept 2015) Negative Consequences of Use: Personal relationships;Work / Programmer, multimediachool Withdrawal Symptoms: Weakness;Diarrhea;Sweats;Irritability Substance #1 Name of Substance 1: Alcohol 1 - Age of First Use: 33 years old 1 - Amount (size/oz): various amounts 1 - Frequency: "not everyday, but when I drink, I binge." 1 - Duration: 2 days  1 - Last Use / Amount: yesteday 04/19/14; unknown amount  Substance #2 Name of Substance 2: cocaine 2 - Age of First Use: 14 2 - Amount (size/oz): various 2 - Frequency: weekly 2 - Duration: usually once a week/when he has money  2 - Last Use / Amount: yesterday, 10/23 Substance #3 Name of Substance 3: marijuana 3 - Age of First Use: 14 3 - Amount (size/oz): various amounts 3 - Frequency: daily 3 - Duration: dailly/nights 3 - Last Use / Amount: yesterday, 04/19/14  CIWA: CIWA-Ar BP: 131/70 mmHg Pulse Rate: 62 Nausea and Vomiting: no nausea and no vomiting Tactile Disturbances: none Tremor: no  tremor Auditory Disturbances: not present Paroxysmal Sweats: no sweat visible Visual Disturbances: not present Anxiety: no anxiety, at ease Headache, Fullness in Head: none present Agitation: normal activity Orientation and Clouding of Sensorium: oriented and can do serial additions CIWA-Ar Total: 0 COWS:    PATIENT  STRENGTHS: (choose at least two) Ability for insight Average or above average intelligence Communication skills General fund of knowledge Motivation for treatment/growth Physical Health Supportive family/friends  Allergies:  Allergies  Allergen Reactions  . Penicillins     unknown    Home Medications:  (Not in a hospital admission)  OB/GYN Status:  No LMP for male patient.  General Assessment Data [facility] ACT Assessment: Yes Is this a Tele or Face-to-Face Assessment?: Tele Assessment Is this an Initial Assessment or a Re-assessment for this encounter?: Initial Assessment Living Arrangements: Children;Parent Can pt return to current living arrangement?: Yes Admission Status: Voluntary Is patient capable of signing voluntary admission?: Yes Transfer from: Home Referral Source: Self/Family/Friend  Medical Screening Exam [facility] Walk-in ONLY) Medical Exam completed: Yes  [facility] Living Arrangements: Children;Parent Name of Psychiatrist: n/a Name of Therapist: n/a  Education Status Is patient currently in school?: No Current Grade: n/a Highest grade of school patient has completed: high school Name of school: n/a Contact person: n/a  Risk to self with the past 6 months Suicidal Ideation: Yes-Currently Present Suicidal Intent: No-Not Currently/Within Last 6 Months Is patient at risk for suicide?: Yes Suicidal Plan?: No-Not Currently/Within Last 6 Months Access to Means: No What has been your use of drugs/alcohol within the last 12 months?: alcohol (binge drinks), cocaine, and daily marijuana use-last use 10/23 Previous Attempts/Gestures: No How many times?: 0 Other Self Harm Risks: unknown Triggers for Past Attempts:  (no identified past attempts-pt reports SI (no plan)) Intentional Self Injurious Behavior: None Family Suicide History: No Recent stressful life event(s): Financial Problems;Other (Comment) (stress pertaining to job,  caring for children) Persecutory voices/beliefs?: No Depression: Yes Depression Symptoms: Fatigue;Loss of interest in usual pleasures;Feeling worthless/self pity Substance abuse history and/or treatment for substance abuse?: Yes [facility] 2006) Suicide prevention information given to non-admitted patients: Yes  Risk to Others within the past 6 months Homicidal Ideation: No Thoughts of Harm to Others: No Current Homicidal Intent: No Current Homicidal Plan: No Access to Homicidal Means: No Identified Victim: noone History of harm to others?: No Assessment of Violence: None Noted Violent Behavior Description: none Does patient have access to weapons?: No Criminal Charges Pending?: No Does patient have a court date: No  Psychosis Hallucinations: None noted Delusions: None noted  Mental Status Report Appear/Hygiene: Unremarkable;In scrubs Eye Contact: Fair Motor Activity: Unremarkable Speech: Slow;Soft Level of Consciousness: Drowsy Mood: Guilty;Depressed (lethargic) Affect: Depressed;Flat Anxiety Level: Minimal Thought Processes: Coherent;Relevant Judgement: Unimpaired Orientation: Person;Place;Time;Situation;Appropriate for developmental age Obsessive Compulsive Thoughts/Behaviors: None  Cognitive Functioning Concentration: Poor Memory: Recent Intact IQ: Average Insight: Fair Impulse Control: Good Appetite: Poor Weight Loss: 5 Weight Gain: 0 Sleep: Decreased Total Hours of Sleep: 5 Vegetative Symptoms: None  ADLScreening [facility] Assessment Services) Patient's cognitive ability adequate to safely complete daily activities?: Yes Patient able to express need for assistance with ADLs?: Yes Independently performs ADLs?: Yes (appropriate for developmental age)  Prior Inpatient Therapy Prior Inpatient Therapy: No Prior Therapy Dates: n/a Prior Therapy Facilty/Provider(s): n/a Reason for Treatment: n/a  Prior Outpatient Therapy Prior Outpatient Therapy: No Prior Therapy  Dates: n/a Prior Therapy Facilty/Provider(s): n/a Reason for Treatment: n/a  ADL Screening (condition at time of admission)  Patient's cognitive ability adequate to safely complete daily activities?: Yes Is the patient deaf or have difficulty hearing?: No Does the patient have difficulty seeing, even when wearing glasses/contacts?: No Does the patient have difficulty concentrating, remembering, or making decisions?: No Patient able to express need for assistance with ADLs?: Yes Does the patient have difficulty dressing or bathing?: No Independently performs ADLs?: Yes (appropriate for developmental age) Does the patient have difficulty walking or climbing stairs?: No Weakness of Legs: None Weakness of Arms/Hands: None  Home Assistive Devices/Equipment Home Assistive Devices/Equipment: None  Therapy Consults (therapy consults require a physician order) PT Evaluation Needed: No OT Evalulation Needed: No SLP Evaluation Needed: No Abuse/Neglect Assessment (Assessment to be complete while patient is alone) Physical Abuse: Denies Verbal Abuse: Denies Sexual Abuse: Denies Exploitation of patient/patient's resources: Denies Self-Neglect: Denies Possible abuse reported to::  (n/a) Values / Beliefs Cultural Requests During Hospitalization: None Spiritual Requests During Hospitalization: None Consults Spiritual Care Consult Needed: No Social Work Consult Needed: No Merchant navy officer (For Healthcare) Does patient have an advance directive?: No Would patient like information on creating an advanced directive?: No - patient declined information Nutrition Screen- [facility] Adult/WL/AP Patient's home diet: Regular  Additional Information 1:1 In Past 12 Months?: No CIRT Risk: No Elopement Risk: No Does patient have medical clearance?: Yes  Child/Adolescent Assessment Running Away Risk: Denies Bed-Wetting: Denies Destruction of Property: Denies Cruelty to Animals: Denies Stealing:  Denies Rebellious/Defies Authority: Denies Satanic Involvement: Denies Archivist: Denies Problems at [employer]: Denies Gang Involvement: Denies  Disposition:  Disposition Initial Assessment Completed for this Encounter: Yes Disposition of Patient: Inpatient treatment program (per Garey Ham, NP. TTS seeking placement) Type of inpatient treatment program: Adult  Micah Noel 04/20/2014 5:47 PM

## 2014-04-21 ENCOUNTER — Encounter (HOSPITAL_COMMUNITY): Payer: Self-pay | Admitting: *Deleted

## 2014-04-21 ENCOUNTER — Observation Stay (HOSPITAL_COMMUNITY)
Admission: AD | Admit: 2014-04-21 | Discharge: 2014-04-22 | Disposition: A | Discharge status: Home / Self Care | Payer: Self-pay | Source: Intra-hospital | Attending: Psychiatry | Admitting: Psychiatry

## 2014-04-21 DIAGNOSIS — F319 Bipolar disorder, unspecified: Principal | ICD-10-CM | POA: Diagnosis present

## 2014-04-21 DIAGNOSIS — F1410 Cocaine abuse, uncomplicated: Secondary | ICD-10-CM | POA: Insufficient documentation

## 2014-04-21 DIAGNOSIS — F1010 Alcohol abuse, uncomplicated: Secondary | ICD-10-CM | POA: Insufficient documentation

## 2014-04-21 DIAGNOSIS — F3130 Bipolar disorder, current episode depressed, mild or moderate severity, unspecified: Secondary | ICD-10-CM

## 2014-04-21 DIAGNOSIS — F1994 Other psychoactive substance use, unspecified with psychoactive substance-induced mood disorder: Secondary | ICD-10-CM

## 2014-04-21 DIAGNOSIS — F1210 Cannabis abuse, uncomplicated: Secondary | ICD-10-CM | POA: Insufficient documentation

## 2014-04-21 DIAGNOSIS — Z79899 Other long term (current) drug therapy: Secondary | ICD-10-CM | POA: Insufficient documentation

## 2014-04-21 DIAGNOSIS — F1019 Alcohol abuse with unspecified alcohol-induced disorder: Secondary | ICD-10-CM

## 2014-04-21 DIAGNOSIS — F39 Unspecified mood [affective] disorder: Secondary | ICD-10-CM | POA: Insufficient documentation

## 2014-04-21 DIAGNOSIS — Z880 Allergy status to penicillin: Secondary | ICD-10-CM | POA: Insufficient documentation

## 2014-04-21 MED ORDER — HYDROXYZINE HCL 25 MG PO TABS: 25.0000 mg | ORAL_TABLET | Freq: Four times a day (QID) | ORAL | Status: DC | PRN

## 2014-04-21 MED ORDER — ACETAMINOPHEN 325 MG PO TABS: 650.0000 mg | ORAL_TABLET | Freq: Four times a day (QID) | ORAL | Status: DC | PRN

## 2014-04-21 MED ORDER — NICOTINE 21 MG/24HR TD PT24
21.0000 mg | MEDICATED_PATCH | Freq: Every day | TRANSDERMAL | Status: DC
  Filled 2014-04-21 (×2): qty 1

## 2014-04-21 MED ORDER — MAGNESIUM HYDROXIDE 400 MG/5ML PO SUSP: 30.0000 mL | Freq: Every day | ORAL | Status: DC | PRN

## 2014-04-21 MED ORDER — TRAZODONE HCL 50 MG PO TABS: 50.0000 mg | ORAL_TABLET | Freq: Every evening | ORAL | Status: DC | PRN

## 2014-04-21 MED ORDER — ALUM & MAG HYDROXIDE-SIMETH 200-200-20 MG/5ML PO SUSP: 30.0000 mL | ORAL | Status: DC | PRN

## 2014-04-21 NOTE — Progress Notes (Signed)
Pt admitted to Observation Unit requesting help with substance abuse and Bipolar D/o. Reports using Alcohol, Marijuana and Cocaine . States no treatment and off medication Depakote, lexapro and trazodone for 8 yrs. [facility] and multiple inpatient admissions. Patient states he want to stop using illicit drugs and get back on medications. Patient has 6 children and feels a responsibility to them and his family.  Patient denies SI/HI/AVH and verbally contracts for safety. He admits to multiple suicide attempts, last one 2004 with overdose. Encouragement and support given. Will monitor closely and evaluate for stabilization.

## 2014-04-21 NOTE — BH Assessment (Addendum)
[facility] Assessment Progress Note  Update: The following facilities were contacted by this clinician to follow up on pt referral:   UNDER REVIEW  [facility] - no beds, but faxed for possible wait list OV - no beds, but faxed for possible wait list [place]   AT CAPACITY  [facility] [place] [address] [facility]  [facility] DECLINED [facility] per CasparDanny @ [phone] by their nurse, Karen Kitchensina Iheacho - he cannot recall reason, left message on [facility] voicemail (on land line)  NO ANSWER, LEFT MESSAGE [facility]  TTS will continue to seek placement for the pt.   Casimer LaniusKristen Felise Georgia, MS, [facility]  Licensed Professional Counselor  Therapeutic Triage Specialist  [facility]  Phone: [phone]  Fax: [phone]

## 2014-04-21 NOTE — ED Notes (Signed)
TTS being performed by Julieanne CottonJosephine, NP, [facility].

## 2014-04-21 NOTE — ED Notes (Signed)
Pt's mother Earney Hamburg- Rhonda Verge - [phone]- [phone]

## 2014-04-21 NOTE — BH Assessment (Signed)
[facility] Assessment Progress Note   Update:  Consulted with Dr. Alta CorningAhktar after talking with Berneice Heinrichina Tate, [facility] and pt [facility] 18[facility].  TTS and ED staff updated.  Pt accepted to Bed 2 to Dr. Lucianne MussKumar.  Casimer LaniusKristen Ghina Bittinger, MS, [facility] Licensed Professional Counselor Therapeutic Triage Specialist [facility] Phone: [phone] Fax: [phone]

## 2014-04-21 NOTE — ED Notes (Addendum)
Per Julieanne Cottonina, AC, [facility] Kumar. May call report after 1900.

## 2014-04-21 NOTE — Consult Note (Signed)
Telepsych Consultation   Reason for Consult:  Substance abuse, depression Referring Physician:  EDP VU LIEBMAN is an 33 y.o. male.  Assessment: AXIS I:  Bipolar, Depressed, Major Depression, Recurrent severe, Substance Induced Mood Disorder and Alcohol sbuse AXIS II:  Deferred AXIS III:   Past Medical History  Diagnosis Date  . Bipolar 1 disorder, depressed    AXIS IV:  other psychosocial or environmental problems and problems related to social environment AXIS V:  41-50 serious symptoms  Plan:  Recommend psychiatric Inpatient admission when medically cleared.  Subjective:   ORBY TANGEN is a 33 y.o. male patient admitted with Major depressive disorder, substance abuse, Hx of Bipolar depressed.Marland Kitchen  HPI:  [name], 73 years ols was seen at [facility] hospital ER seeking treatment for substance abuse and Bipolar disorder.  Patient reports that he does not drink alcohol everyday but he binges on alcohol.  Patient states that when he drinks he drinks until he passes out.  He reports using Marijuana and Cocaine since age 85.  He denies daily use but stated that he uses these two drugs 2-3 times a week.  Patient has a diagnosis of Bipolar disorder.  He states that he last took medications or saw a provider 2006.    He report been treated inpatient for his Bipolar at [facility] in [place].  He reports that he stopped taking medications for Bipolar but have been using Alcohol and illicit drugs as his treatment.  Patient was being seen at [facility] focus in [place] until he stopped going there since 2006.  Patient states that he want to stop using illicit drugs and get back to his medications.  Patient states he has 4  little children and that he want to be involved in  their lives.  Patient denies SI/HI/AVH.   He reports previous attempt of suicide back in 2000 when he overdosed on medication.  He was taken to [place] at that time. Patient states that he is interested in treatment and that he was doing  much better when he was taking his medications.  We have accepted patient for admission and will be looking for placement since we are at capacity.  HPI Elements:   Location:  Hx of Bipolar depressed, Major depression, Alcohol abuse, Marijuana abuse, cocaine abuse. Quality:  Irritability, guilt for using illicit drugs, anxiety, anger issues. Severity:  severe. Duration:  since age 19 using illicit substances, Bipoar since 2000. Context:  Want to change life style, want to stop using illicit drugs and get back on his medications..  Past Psychiatric History: Past Medical History  Diagnosis Date  . Bipolar 1 disorder, depressed     reports that he has been smoking Cigarettes.  He has a 10 pack-year smoking history. He does not have any smokeless tobacco history on file. He reports that he does not drink alcohol or use illicit drugs. Family History  Problem Relation Age of Onset  . Cancer Mother   . Diabetes Mother   . Hypertension Mother    Family History Substance Abuse: Yes, Describe: (alcohol (binge drinks) cocaine use weekly; THC use daily) Family Supports: Yes, List: (fiance, six kids, and mother) Living Arrangements: Children;Parent Can pt return to current living arrangement?: Yes Allergies:   Allergies  Allergen Reactions  . Penicillins     unknown    ACT Assessment Complete:  Yes:    Educational Status    Risk to Self: Risk to self with the past 6 months Suicidal Ideation:  Yes-Currently Present Suicidal Intent: No-Not Currently/Within Last 6 Months Is patient at risk for suicide?: Yes Suicidal Plan?: No-Not Currently/Within Last 6 Months Access to Means: No What has been your use of drugs/alcohol within the last 12 months?: alcohol (binge drinks), cocaine, and daily marijuana use-last use 10/23 Previous Attempts/Gestures: No How many times?: 0 Other Self Harm Risks: unknown Triggers for Past Attempts:  (no identified past attempts-pt reports SI (no  plan)) Intentional Self Injurious Behavior: None Family Suicide History: No Recent stressful life event(s): Financial Problems;Other (Comment) (stress pertaining to job, caring for children) Persecutory voices/beliefs?: No Depression: Yes Depression Symptoms: Fatigue;Loss of interest in usual pleasures;Feeling worthless/self pity Substance abuse history and/or treatment for substance abuse?: Yes [facility] 2006) Suicide prevention information given to non-admitted patients: Yes  Risk to Others: Risk to Others within the past 6 months Homicidal Ideation: No Thoughts of Harm to Others: No Current Homicidal Intent: No Current Homicidal Plan: No Access to Homicidal Means: No Identified Victim: noone History of harm to others?: No Assessment of Violence: None Noted Violent Behavior Description: none Does patient have access to weapons?: No Criminal Charges Pending?: No Does patient have a court date: No  Abuse: Abuse/Neglect Assessment (Assessment to be complete while patient is alone) Physical Abuse: Denies Verbal Abuse: Denies Sexual Abuse: Denies Exploitation of patient/patient's resources: Denies Self-Neglect: Denies Possible abuse reported to::  (n/a)  Prior Inpatient Therapy: Prior Inpatient Therapy Prior Inpatient Therapy: No Prior Therapy Dates: n/a Prior Therapy Facilty/Provider(s): n/a Reason for Treatment: n/a  Prior Outpatient Therapy: Prior Outpatient Therapy Prior Outpatient Therapy: No Prior Therapy Dates: n/a Prior Therapy Facilty/Provider(s): n/a Reason for Treatment: n/a  Additional Information: Additional Information 1:1 In Past 12 Months?: No CIRT Risk: No Elopement Risk: No Does patient have medical clearance?: Yes   Objective: Blood pressure 112/62, pulse 57, temperature 97.8 F (36.6 C), temperature source Oral, resp. rate 16, SpO2 99.00%.There is no weight on file to calculate BMI. Results for orders placed during the hospital encounter of 04/20/14 (from  the past 72 hour(s))  CBC WITH DIFFERENTIAL     Status: None   Collection Time    04/20/14  3:21 PM      Result Value Ref Range   WBC 9.4  4.0 - 10.5 K/uL   RBC 5.04  4.22 - 5.81 MIL/uL   Hemoglobin 14.7  13.0 - 17.0 g/dL   HCT 42.5  39.0 - 52.0 %   MCV 84.3  78.0 - 100.0 fL   MCH 29.2  26.0 - 34.0 pg   MCHC 34.6  30.0 - 36.0 g/dL   RDW 12.9  11.5 - 15.5 %   Platelets 239  150 - 400 K/uL   Neutrophils Relative % 59  43 - 77 %   Neutro Abs 5.6  1.7 - 7.7 K/uL   Lymphocytes Relative 33  12 - 46 %   Lymphs Abs 3.1  0.7 - 4.0 K/uL   Monocytes Relative 8  3 - 12 %   Monocytes Absolute 0.7  0.1 - 1.0 K/uL   Eosinophils Relative 0  0 - 5 %   Eosinophils Absolute 0.0  0.0 - 0.7 K/uL   Basophils Relative 0  0 - 1 %   Basophils Absolute 0.0  0.0 - 0.1 K/uL  COMPREHENSIVE METABOLIC PANEL     Status: Abnormal   Collection Time    04/20/14  3:21 PM      Result Value Ref Range   Sodium 137  137 - 147  mEq/L   Potassium 3.6 (*) 3.7 - 5.3 mEq/L   Chloride 100  96 - 112 mEq/L   CO2 25  19 - 32 mEq/L   Glucose, Bld 98  70 - 99 mg/dL   BUN 11  6 - 23 mg/dL   Creatinine, Ser 1.09  0.50 - 1.35 mg/dL   Calcium 9.2  8.4 - 10.5 mg/dL   Total Protein 7.4  6.0 - 8.3 g/dL   Albumin 3.9  3.5 - 5.2 g/dL   AST 22  0 - 37 U/L   ALT 21  0 - 53 U/L   Alkaline Phosphatase 45  39 - 117 U/L   Total Bilirubin 0.5  0.3 - 1.2 mg/dL   GFR calc non Af Amer 88 (*) >90 mL/min   GFR calc Af Amer >90  >90 mL/min   Comment: (NOTE)     The eGFR has been calculated using the CKD EPI equation.     This calculation has not been validated in all clinical situations.     eGFR's persistently <90 mL/min signify possible Chronic Kidney     Disease.   Anion gap 12  5 - 15  ETHANOL     Status: None   Collection Time    04/20/14  3:21 PM      Result Value Ref Range   Alcohol, Ethyl (B) <11  0 - 11 mg/dL   Comment:            LOWEST DETECTABLE LIMIT FOR     SERUM ALCOHOL IS 11 mg/dL     FOR MEDICAL PURPOSES ONLY   URINE RAPID DRUG SCREEN (HOSP PERFORMED)     Status: Abnormal   Collection Time    04/20/14  3:58 PM      Result Value Ref Range   Opiates NONE DETECTED  NONE DETECTED   Cocaine POSITIVE (*) NONE DETECTED   Benzodiazepines NONE DETECTED  NONE DETECTED   Amphetamines NONE DETECTED  NONE DETECTED   Tetrahydrocannabinol POSITIVE (*) NONE DETECTED   Barbiturates NONE DETECTED  NONE DETECTED   Comment:            DRUG SCREEN FOR MEDICAL PURPOSES     ONLY.  IF CONFIRMATION IS NEEDED     FOR ANY PURPOSE, NOTIFY LAB     WITHIN 5 DAYS.                LOWEST DETECTABLE LIMITS     FOR URINE DRUG SCREEN     Drug Class       Cutoff (ng/mL)     Amphetamine      1000     Barbiturate      200     Benzodiazepine   101     Tricyclics       751     Opiates          300     Cocaine          300     THC              50   Labs are reviewed and are pertinent for UDS positive for Cocaine and Marijuana and Cocaine.  Current Facility-Administered Medications  Medication Dose Route Frequency Provider Last Rate Last Dose  . acetaminophen (TYLENOL) tablet 650 mg  650 mg Oral Q4H PRN Jasper Riling. Pickering, MD      . alum & mag hydroxide-simeth (MAALOX/MYLANTA) 200-200-20 MG/5ML suspension 30 mL  30 mL Oral  PRN Jasper Riling. Pickering, MD      . ibuprofen (ADVIL,MOTRIN) tablet 600 mg  600 mg Oral Q8H PRN Jasper Riling. Pickering, MD      . LORazepam (ATIVAN) tablet 0-4 mg  0-4 mg Oral 4 times per day Jasper Riling. Alvino Chapel, MD       Followed by  . [START ON 04/22/2014] LORazepam (ATIVAN) tablet 0-4 mg  0-4 mg Oral Q12H Nathan R. Pickering, MD      . LORazepam (ATIVAN) tablet 1 mg  1 mg Oral Q8H PRN Jasper Riling. Pickering, MD      . ondansetron [facility]) tablet 4 mg  4 mg Oral Q8H PRN Jasper Riling. Pickering, MD      . zolpidem (AMBIEN) tablet 5 mg  5 mg Oral QHS PRN Jasper Riling. Alvino Chapel, MD       Current Outpatient Prescriptions  Medication Sig Dispense Refill  . predniSONE (DELTASONE) 50 MG tablet Take 1 tablet (50 mg  total) by mouth daily.  5 tablet  0  . traMADol (ULTRAM) 50 MG tablet Take 1 tablet (50 mg total) by mouth at bedtime as needed (cough).  10 tablet  0    Psychiatric Specialty Exam:     Blood pressure 112/62, pulse 57, temperature 97.8 F (36.6 C), temperature source Oral, resp. rate 16, SpO2 99.00%.There is no weight on file to calculate BMI.  General Appearance: Casual and Fairly Groomed  Engineer, water::  Good  Speech:  Clear and Coherent and Normal Rate  Volume:  Normal  Mood:  Angry  Affect:  Congruent and Depressed  Thought Process:  Coherent, Goal Directed and Intact  Orientation:  Full (Time, Place, and Person)  Thought Content:  WDL  Suicidal Thoughts:  No  Homicidal Thoughts:  No  Memory:  Immediate;   Good Recent;   Good Remote;   Good  Judgement:  Fair  Insight:  Good  Psychomotor Activity:  Normal  Concentration:  Good  Recall:  NA  Akathisia:  NA  Handed:  Right  AIMS (if indicated):     Assets:  Desire for Improvement  Sleep:      Treatment Plan Summary:  Dr De Nurse concur to this assessment that patient does need inpatient hospitalization. Accepted for admission at any inpatient Psychiatric hospital with available beds  Disposition:  Admit Disposition Initial Assessment Completed for this Encounter: Yes Disposition of Patient: Inpatient treatment program (per Burnadette Peter, NP. TTS seeking placement) Type of inpatient treatment program: Adult  Delfin Gant   PMHNOP-BC 04/21/2014 12:04 PM   I have agree with plan and discussed patient.

## 2014-04-21 NOTE — Plan of Care (Signed)
[facility] Observation Crisis Plan  Reason for Crisis Plan:  Crisis Stabilization   Plan of Care:  Referral for Substance Abuse  Family Support:    Earney Hamburghonda Brahm mother  Current Living Environment:  Living Arrangements: Parent  Insurance:   Hospital Account   Name Acct ID Class Status Primary Coverage   Aron BabaFerree, Tiwan R [id number] BEHAVIORAL HEALTH OBSERVATION Open None        Guarantor Account (for Hospital Account [id number])   Name Relation to Pt Service Area Active? Acct Type   Aron BabaFerree, Graylon R Self [facility] Yes Behavioral Health   Address Phone       [address] [place], [place] [address] [phone](H)          Coverage Information (for Hospital Account [id number])   Not on file      Legal Guardian:   none  Primary Care Provider:  No PCP Per Patient  Current Outpatient Providers:  none  Psychiatrist:   none  Counselor/Therapist:   none  Compliant with Medications:  Off medications X 8years  Additional Information:   Celene KrasRobinson, Heike Pounds G 10/25/20158:43 PM

## 2014-04-21 NOTE — Progress Notes (Signed)
[facility] INPATIENT:  Family/Significant Other Suicide Prevention Education  Suicide Prevention Education:  Patient Refusal for Family/Significant Other Suicide Prevention Education: The patient Patrick [name] has refused to provide written consent for family/significant other to be provided Family/Significant Other Suicide Prevention Education during admission and/or prior to discharge.   Celene KrasRobinson, [place] Hijazi G 04/21/2014, 8:53 PM

## 2014-04-22 MED ORDER — PREDNISONE 50 MG PO TABS: 50.0000 mg | ORAL_TABLET | Freq: Every day | ORAL | Status: DC

## 2014-04-22 NOTE — Discharge Summary (Signed)
[facility] OBS UNIT DISCHARGE SUMMARY   Patrick [name] is an 33 y.o. [name].  Assessment: AXIS I:  Bipolar, Depressed, Major Depression, Recurrent severe, Substance Abuse and Substance Induced Mood Disorder AXIS II:  Deferred AXIS III:   Past Medical History  Diagnosis Date  . Bipolar 1 disorder, depressed    AXIS IV:  other psychosocial or environmental problems and problems related to social environment AXIS V:  51-60 moderate symptoms  Plan:  Discharge home with outpatient resources as assisted by Tom with [facility] TTS. Pt's wife is now present and pt is ready to transport pt home.   Subjective:   Patrick [name] is a 33 y.o. [name] patient admitted with Major depressive disorder, substance abuse, Hx of Bipolar depressed. Pt denies SI, HI, and AVH, contracts for safety. Pt reports that he is feeling better at this time and is open to followup with outpatient resources.   HPI:  Patrick [name], 80 years ols was seen at [facility] hospital ER seeking treatment for substance abuse and Bipolar disorder.  Patient reports that he does not drink alcohol everyday but he binges on alcohol.  Patient states that when he drinks he drinks until he passes out.  He reports using Marijuana and Cocaine since age 49.  He denies daily use but stated that he uses these two drugs 2-3 times a week.  Patient has a diagnosis of Bipolar disorder.  He states that he last took medications or saw a provider 2006.    He report been treated inpatient for his Bipolar at [facility] in [place].  He reports that he stopped taking medications for Bipolar but have been using Alcohol and illicit drugs as his treatment.  Patient was being seen at [facility] focus in [place] until he stopped going there since 2006.  Patient states that he want to stop using illicit drugs and get back to his medications.  Patient states he has 4  little children and that he want to be involved in  their lives.  Patient denies SI/HI/AVH.   He reports previous attempt of suicide back in  2000 when he overdosed on medication.  He was taken to [place] at that time. Patient states that he is interested in treatment and that he was doing much better when he was taking his medications.  We have accepted patient for admission and will be looking for placement since we are at capacity.  HPI Elements:   Location:  Hx of Bipolar depressed, Major depression, Alcohol abuse, Marijuana abuse, cocaine abuse. Quality:  Irritability, guilt for using illicit drugs, anxiety, anger issues. Severity:  severe. Duration:  since age 73 using illicit substances, Bipoar since 2000. Context:  Want to change life style, want to stop using illicit drugs and get back on his medications..  Past Psychiatric History: Past Medical History  Diagnosis Date  . Bipolar 1 disorder, depressed     reports that he has been smoking Cigarettes.  He has a 10 pack-year smoking history. He does not have any smokeless tobacco history on file. He reports that he does not drink alcohol or use illicit drugs. Family History  Problem Relation Age of Onset  . Cancer Mother   . Diabetes Mother   . Hypertension Mother      Living Arrangements: Parent   Allergies:   Allergies  Allergen Reactions  . Penicillins     unknown    ACT Assessment Complete:  Yes:    Educational Status    Risk to  Self: Risk to self with the past 6 months Is patient at risk for suicide?: No  Risk to Others:    Abuse: Abuse/Neglect Assessment (Assessment to be complete while patient is alone) Physical Abuse: Denies Verbal Abuse: Denies Sexual Abuse: Denies Exploitation of patient/patient's resources: Denies Self-Neglect: Denies  Prior Inpatient Therapy:    Prior Outpatient Therapy:    Additional Information:     Objective: Blood pressure 87/43, pulse 57, temperature 98.3 F (36.8 C), temperature source Oral, resp. rate 18, height 6' 9.5" (2.07 m), weight 78.926 kg (174 lb), SpO2 100.00%.Body mass index is 18.42  kg/(m^2). Results for orders placed during the hospital encounter of 04/20/14 (from the past 72 hour(s))  CBC WITH DIFFERENTIAL     Status: None   Collection Time    04/20/14  3:21 PM      Result Value Ref Range   WBC 9.4  4.0 - 10.5 K/uL   RBC 5.04  4.22 - 5.81 MIL/uL   Hemoglobin 14.7  13.0 - 17.0 g/dL   HCT 42.5  39.0 - 52.0 %   MCV 84.3  78.0 - 100.0 fL   MCH 29.2  26.0 - 34.0 pg   MCHC 34.6  30.0 - 36.0 g/dL   RDW 12.9  11.5 - 15.5 %   Platelets 239  150 - 400 K/uL   Neutrophils Relative % 59  43 - 77 %   Neutro Abs 5.6  1.7 - 7.7 K/uL   Lymphocytes Relative 33  12 - 46 %   Lymphs Abs 3.1  0.7 - 4.0 K/uL   Monocytes Relative 8  3 - 12 %   Monocytes Absolute 0.7  0.1 - 1.0 K/uL   Eosinophils Relative 0  0 - 5 %   Eosinophils Absolute 0.0  0.0 - 0.7 K/uL   Basophils Relative 0  0 - 1 %   Basophils Absolute 0.0  0.0 - 0.1 K/uL  COMPREHENSIVE METABOLIC PANEL     Status: Abnormal   Collection Time    04/20/14  3:21 PM      Result Value Ref Range   Sodium 137  137 - 147 mEq/L   Potassium 3.6 (*) 3.7 - 5.3 mEq/L   Chloride 100  96 - 112 mEq/L   CO2 25  19 - 32 mEq/L   Glucose, Bld 98  70 - 99 mg/dL   BUN 11  6 - 23 mg/dL   Creatinine, Ser 1.09  0.50 - 1.35 mg/dL   Calcium 9.2  8.4 - 10.5 mg/dL   Total Protein 7.4  6.0 - 8.3 g/dL   Albumin 3.9  3.5 - 5.2 g/dL   AST 22  0 - 37 U/L   ALT 21  0 - 53 U/L   Alkaline Phosphatase 45  39 - 117 U/L   Total Bilirubin 0.5  0.3 - 1.2 mg/dL   GFR calc non Af Amer 88 (*) >90 mL/min   GFR calc Af Amer >90  >90 mL/min   Comment: (NOTE)     The eGFR has been calculated using the CKD EPI equation.     This calculation has not been validated in all clinical situations.     eGFR's persistently <90 mL/min signify possible Chronic Kidney     Disease.   Anion gap 12  5 - 15  ETHANOL     Status: None   Collection Time    04/20/14  3:21 PM      Result Value Ref Range  Alcohol, Ethyl (B) <11  0 - 11 mg/dL   Comment:            LOWEST  DETECTABLE LIMIT FOR     SERUM ALCOHOL IS 11 mg/dL     FOR MEDICAL PURPOSES ONLY  URINE RAPID DRUG SCREEN (HOSP PERFORMED)     Status: Abnormal   Collection Time    04/20/14  3:58 PM      Result Value Ref Range   Opiates NONE DETECTED  NONE DETECTED   Cocaine POSITIVE (*) NONE DETECTED   Benzodiazepines NONE DETECTED  NONE DETECTED   Amphetamines NONE DETECTED  NONE DETECTED   Tetrahydrocannabinol POSITIVE (*) NONE DETECTED   Barbiturates NONE DETECTED  NONE DETECTED   Comment:            DRUG SCREEN FOR MEDICAL PURPOSES     ONLY.  IF CONFIRMATION IS NEEDED     FOR ANY PURPOSE, NOTIFY LAB     WITHIN 5 DAYS.                LOWEST DETECTABLE LIMITS     FOR URINE DRUG SCREEN     Drug Class       Cutoff (ng/mL)     Amphetamine      1000     Barbiturate      200     Benzodiazepine   671     Tricyclics       245     Opiates          300     Cocaine          300     THC              50   Labs are reviewed and are pertinent for UDS positive for Cocaine and Marijuana and Cocaine.  Current Facility-Administered Medications  Medication Dose Route Frequency Provider Last Rate Last Dose  . acetaminophen (TYLENOL) tablet 650 mg  650 mg Oral Q6H PRN Evanna Glenda Chroman, NP      . alum & mag hydroxide-simeth (MAALOX/MYLANTA) 200-200-20 MG/5ML suspension 30 mL  30 mL Oral Q4H PRN Evanna Glenda Chroman, NP      . hydrOXYzine (ATARAX/VISTARIL) tablet 25 mg  25 mg Oral Q6H PRN Evanna Glenda Chroman, NP      . magnesium hydroxide (MILK OF MAGNESIA) suspension 30 mL  30 mL Oral Daily PRN Evanna Cori Burkett, NP      . nicotine (NICODERM CQ - dosed in mg/24 hours) patch 21 mg  21 mg Transdermal Daily Evanna Cori Burkett, NP      . traZODone (DESYREL) tablet 50 mg  50 mg Oral QHS PRN,MR X 1 Evanna Glenda Chroman, NP        Psychiatric Specialty Exam:     Blood pressure 87/43, pulse 57, temperature 98.3 F (36.8 C), temperature source Oral, resp. rate 18, height 6' 9.5" (2.07 m), weight 78.926 kg (174  lb), SpO2 100.00%.Body mass index is 18.42 kg/(m^2).  General Appearance: Casual and Fairly Groomed  Engineer, water::  Good  Speech:  Clear and Coherent and Normal Rate  Volume:  Normal  Mood:  Angry  Affect:  Congruent and Depressed  Thought Process:  Coherent, Goal Directed and Intact  Orientation:  Full (Time, Place, and Person)  Thought Content:  WDL  Suicidal Thoughts:  No  Homicidal Thoughts:  No  Memory:  Immediate;   Good Recent;   Good Remote;   Good  Judgement:  Fair  Insight:  Good  Psychomotor Activity:  Normal  Concentration:  Good  Recall:  NA  Akathisia:  NA  Handed:  Right  AIMS (if indicated):     Assets:  Desire for Improvement  Sleep:      Treatment Plan Summary:   -Discharge home with outpatient resources/followups as assisted by [facility].  -Discharge home with wife whom is present     Benjamine Mola, FNP-BC 04/22/2014 12:10 PM

## 2014-04-22 NOTE — Progress Notes (Signed)
Patient ID: Patrick [name], male   DOB: 04-29-1981, 33 y.o.   MRN: [id number] Telephone call from his mom expressing concern that if he is discharged today he wont follow up with outpatient treatment and he wont take medications.She states his mental health makes him make bad decisions and use drugs and he has a long history of suicide attempts and violence and he has six kids now and he needs to stay here, and if there is anything we can do she would like to see him continue inpatient treatment. Told her the NP has already seen him and determined he can be discharged but would let him know here concerns.

## 2014-04-22 NOTE — Discharge Instructions (Signed)
For your ongoing mental health and substance abuse treatment needs, contact [facility].  They see new patients on a walk-in basis from 8:30 am - 12:00 pm, and from 1:00 pm - 2:30 pm, Monday - Friday.       [facility]      [address]      [place], [place] [address]      [phone]  Some people benefit from a structured treatment program to help them maintain a sober lifestyle.  [facility] offers a Chemical Dependency Intensive Outpatient Program (CD-IOP).  Consider contacting them to see if this is the right program for you:       [facility]      [address]      [place], [place] [address]      [phone]

## 2014-04-22 NOTE — Plan of Care (Signed)
[facility] Observation Crisis Plan  Reason for Crisis Plan:  Substance Abuse   Plan of Care:  Referral for Substance Abuse  Family Support:    Fiancee, mother, pt's 6 children  Current Living Environment:  Living Arrangements: Other relatives; Mother, 3 of his children  Insurance:  Self Pay Hospital Account   Name Acct ID Class Status Primary Coverage   [name], [name] [id number] BEHAVIORAL HEALTH OBSERVATION Open None        Guarantor Account (for Hospital Account #[id number])   Name Relation to Pt Service Area Active? Acct Type   [name], [name] Self [facility] Yes Behavioral Health   Address Phone       [address] [place],  [address] [phone](H)          Coverage Information (for Hospital Account #[id number])   Not on file      Legal Guardian:   Self  Primary Care Provider:  No PCP Per Patient  Current Outpatient Providers:  None  Psychiatrist:   None  Counselor/Therapist:   None  Compliant with Medications:  Yes  Additional Information: After consulting with Patrick Withrow, NP it has been determined that pt does not present a life threatening danger to himself or others, and that psychiatric hospitalization is not indicated for him at this time.  Pt is interested in outpatient treatment services for his mental health and substance abuse problems.  He will be referred to [facility].  Patrick Fake, MA Triage Specialist Patrick [name] Patrick 10/26/201511:01 AM 

## 2014-04-22 NOTE — H&P (Signed)
[facility] OBS UNIT H&P   Patrick [name] is an 33 y.o. male.  Assessment: AXIS I:  Bipolar, Depressed, Major Depression, Recurrent severe, Substance Abuse and Substance Induced Mood Disorder AXIS II:  Deferred AXIS III:   Past Medical History  Diagnosis Date  . Bipolar 1 disorder, depressed    AXIS IV:  other psychosocial or environmental problems and problems related to social environment AXIS V:  51-60 moderate symptoms  Plan:  Discharge home with outpatient resources as assisted by Tom with [facility]. Pt's wife is now present and pt is ready to transport pt home.   Subjective:   Patrick [name] is a 33 y.o. male patient admitted with Major depressive disorder, substance abuse, Hx of Bipolar depressed. Pt denies SI, HI, and AVH, contracts for safety. Pt reports that he is feeling better at this time and is open to followup with outpatient resources.   HPI:  [name], 74 years ols was seen at [facility] hospital ER seeking treatment for substance abuse and Bipolar disorder.  Patient reports that he does not drink alcohol everyday but he binges on alcohol.  Patient states that when he drinks he drinks until he passes out.  He reports using Marijuana and Cocaine since age 25.  He denies daily use but stated that he uses these two drugs 2-3 times a week.  Patient has a diagnosis of Bipolar disorder.  He states that he last took medications or saw a provider 2006.    He report been treated inpatient for his Bipolar at [facility] in [place].  He reports that he stopped taking medications for Bipolar but have been using Alcohol and illicit drugs as his treatment.  Patient was being seen at [facility] focus in [place] until he stopped going there since 2006.  Patient states that he want to stop using illicit drugs and get back to his medications.  Patient states he has 4  little children and that he want to be involved in  their lives.  Patient denies SI/HI/AVH.   He reports previous attempt of suicide back in 2000 when he  overdosed on medication.  He was taken to [place] at that time. Patient states that he is interested in treatment and that he was doing much better when he was taking his medications.  We have accepted patient for admission and will be looking for placement since we are at capacity.  HPI Elements:   Location:  Hx of Bipolar depressed, Major depression, Alcohol abuse, Marijuana abuse, cocaine abuse. Quality:  Irritability, guilt for using illicit drugs, anxiety, anger issues. Severity:  severe. Duration:  since age 66 using illicit substances, [place] since 2000. Context:  Want to change life style, want to stop using illicit drugs and get back on his medications..  Past Psychiatric History: Past Medical History  Diagnosis Date  . Bipolar 1 disorder, depressed     reports that he has been smoking Cigarettes.  He has a 10 pack-year smoking history. He does not have any smokeless tobacco history on file. He reports that he does not drink alcohol or use illicit drugs. Family History  Problem Relation Age of Onset  . Cancer Mother   . Diabetes Mother   . Hypertension Mother      Living Arrangements: Parent   Allergies:   Allergies  Allergen Reactions  . Penicillins     unknown    ACT Assessment Complete:  Yes:    Educational Status    Risk to Self:  Risk to self with the past 6 months Is patient at risk for suicide?: No  Risk to Others:    Abuse: Abuse/Neglect Assessment (Assessment to be complete while patient is alone) Physical Abuse: Denies Verbal Abuse: Denies Sexual Abuse: Denies Exploitation of patient/patient's resources: Denies Self-Neglect: Denies  Prior Inpatient Therapy:    Prior Outpatient Therapy:    Additional Information:     Objective: Blood pressure 87/43, pulse 57, temperature 98.3 F (36.8 C), temperature source Oral, resp. rate 18, height 6' 9.5" (2.07 m), weight 78.926 kg (174 lb), SpO2 100.00%.Body mass index is 18.42 kg/(m^2). Results for orders  placed during the hospital encounter of 04/20/14 (from the past 72 hour(s))  CBC WITH DIFFERENTIAL     Status: None   Collection Time    04/20/14  3:21 PM      Result Value Ref Range   WBC 9.4  4.0 - 10.5 K/uL   RBC 5.04  4.22 - 5.81 MIL/uL   Hemoglobin 14.7  13.0 - 17.0 g/dL   HCT 42.5  39.0 - 52.0 %   MCV 84.3  78.0 - 100.0 fL   MCH 29.2  26.0 - 34.0 pg   MCHC 34.6  30.0 - 36.0 g/dL   RDW 12.9  11.5 - 15.5 %   Platelets 239  150 - 400 K/uL   Neutrophils Relative % 59  43 - 77 %   Neutro Abs 5.6  1.7 - 7.7 K/uL   Lymphocytes Relative 33  12 - 46 %   Lymphs Abs 3.1  0.7 - 4.0 K/uL   Monocytes Relative 8  3 - 12 %   Monocytes Absolute 0.7  0.1 - 1.0 K/uL   Eosinophils Relative 0  0 - 5 %   Eosinophils Absolute 0.0  0.0 - 0.7 K/uL   Basophils Relative 0  0 - 1 %   Basophils Absolute 0.0  0.0 - 0.1 K/uL  COMPREHENSIVE METABOLIC PANEL     Status: Abnormal   Collection Time    04/20/14  3:21 PM      Result Value Ref Range   Sodium 137  137 - 147 mEq/L   Potassium 3.6 (*) 3.7 - 5.3 mEq/L   Chloride 100  96 - 112 mEq/L   CO2 25  19 - 32 mEq/L   Glucose, Bld 98  70 - 99 mg/dL   BUN 11  6 - 23 mg/dL   Creatinine, Ser 1.09  0.50 - 1.35 mg/dL   Calcium 9.2  8.4 - 10.5 mg/dL   Total Protein 7.4  6.0 - 8.3 g/dL   Albumin 3.9  3.5 - 5.2 g/dL   AST 22  0 - 37 U/L   ALT 21  0 - 53 U/L   Alkaline Phosphatase 45  39 - 117 U/L   Total Bilirubin 0.5  0.3 - 1.2 mg/dL   GFR calc non Af Amer 88 (*) >90 mL/min   GFR calc Af Amer >90  >90 mL/min   Comment: (NOTE)     The eGFR has been calculated using the CKD EPI equation.     This calculation has not been validated in all clinical situations.     eGFR's persistently <90 mL/min signify possible Chronic Kidney     Disease.   Anion gap 12  5 - 15  ETHANOL     Status: None   Collection Time    04/20/14  3:21 PM      Result Value Ref Range  Alcohol, Ethyl (B) <11  0 - 11 mg/dL   Comment:            LOWEST DETECTABLE LIMIT FOR     SERUM  ALCOHOL IS 11 mg/dL     FOR MEDICAL PURPOSES ONLY  URINE RAPID DRUG SCREEN (HOSP PERFORMED)     Status: Abnormal   Collection Time    04/20/14  3:58 PM      Result Value Ref Range   Opiates NONE DETECTED  NONE DETECTED   Cocaine POSITIVE (*) NONE DETECTED   Benzodiazepines NONE DETECTED  NONE DETECTED   Amphetamines NONE DETECTED  NONE DETECTED   Tetrahydrocannabinol POSITIVE (*) NONE DETECTED   Barbiturates NONE DETECTED  NONE DETECTED   Comment:            DRUG SCREEN FOR MEDICAL PURPOSES     ONLY.  IF CONFIRMATION IS NEEDED     FOR ANY PURPOSE, NOTIFY LAB     WITHIN 5 DAYS.                LOWEST DETECTABLE LIMITS     FOR URINE DRUG SCREEN     Drug Class       Cutoff (ng/mL)     Amphetamine      1000     Barbiturate      200     Benzodiazepine   932     Tricyclics       671     Opiates          300     Cocaine          300     THC              50   Labs are reviewed and are pertinent for UDS positive for Cocaine and Marijuana and Cocaine.  Current Facility-Administered Medications  Medication Dose Route Frequency Provider Last Rate Last Dose  . acetaminophen (TYLENOL) tablet 650 mg  650 mg Oral Q6H PRN Evanna Glenda Chroman, NP      . alum & mag hydroxide-simeth (MAALOX/MYLANTA) 200-200-20 MG/5ML suspension 30 mL  30 mL Oral Q4H PRN Evanna Glenda Chroman, NP      . hydrOXYzine (ATARAX/VISTARIL) tablet 25 mg  25 mg Oral Q6H PRN Evanna Glenda Chroman, NP      . magnesium hydroxide (MILK OF MAGNESIA) suspension 30 mL  30 mL Oral Daily PRN Evanna Cori Burkett, NP      . nicotine (NICODERM CQ - dosed in mg/24 hours) patch 21 mg  21 mg Transdermal Daily Evanna Cori Burkett, NP      . traZODone (DESYREL) tablet 50 mg  50 mg Oral QHS PRN,MR X 1 Evanna Glenda Chroman, NP        Psychiatric Specialty Exam:     Blood pressure 87/43, pulse 57, temperature 98.3 F (36.8 C), temperature source Oral, resp. rate 18, height 6' 9.5" (2.07 m), weight 78.926 kg (174 lb), SpO2 100.00%.Body mass  index is 18.42 kg/(m^2).  General Appearance: Casual and Fairly Groomed  Engineer, water::  Good  Speech:  Clear and Coherent and Normal Rate  Volume:  Normal  Mood:  Angry  Affect:  Congruent and Depressed  Thought Process:  Coherent, Goal Directed and Intact  Orientation:  Full (Time, Place, and Person)  Thought Content:  WDL  Suicidal Thoughts:  No  Homicidal Thoughts:  No  Memory:  Immediate;   Good Recent;   Good Remote;   Good  Judgement:  Fair  Insight:  Good  Psychomotor Activity:  Normal  Concentration:  Good  Recall:  NA  Akathisia:  NA  Handed:  Right  AIMS (if indicated):     Assets:  Desire for Improvement  Sleep:      Treatment Plan Summary:   -Discharge home with outpatient resources/followups as assisted by [facility].      Benjamine Mola, FNP-BC 04/22/2014 8:35 AM

## 2014-04-22 NOTE — Discharge Summary (Signed)
Case discussed, and agree with plan. Patient is safe to be discharged with outpatient resources

## 2014-04-22 NOTE — H&P (Signed)
[facility] for monitoring and detox

## 2014-04-22 NOTE — Progress Notes (Signed)
NSG shift assessment.  D: Pt had a blunted affect and his mood appears depressed, with appropriate behavior. He denies SI/HI, stating that, "Sometimes I have thoughts of hurting myself, but I would never do that because I have six children and want to get home to them. Right now I do not want to hurt myself or others." He ate breakfast and is resting in bed with no complaints voiced.   A: Spent 1:1 time talking with pt. Support offered. Safety maintained with observations every 15 minutes.   R: Pt is cooperative.     D: Patient verbalizes readiness for discharge: Denies SI/HI, is not psychotic or delusional. He left the hospital wearing paper scrubs and hospital socks stating that he did not bring anything else with him. His Search/Securing of Belongings form were consistent with that.    A: Discharge instructions read and discussed with patient. All belongings returned to pt.   R: Pt verbalized understanding of discharge instructions. Signed for return of belongings.   A: Escorted to the lobby.

## 2014-04-24 ENCOUNTER — Encounter (HOSPITAL_COMMUNITY): Payer: Self-pay | Admitting: Emergency Medicine

## 2014-04-24 ENCOUNTER — Emergency Department (HOSPITAL_COMMUNITY)
Admission: EM | Admit: 2014-04-24 | Discharge: 2014-04-25 | Disposition: A | Discharge status: Home / Self Care | Payer: No Typology Code available for payment source | Attending: Emergency Medicine | Admitting: Emergency Medicine

## 2014-04-24 DIAGNOSIS — F329 Major depressive disorder, single episode, unspecified: Secondary | ICD-10-CM | POA: Insufficient documentation

## 2014-04-24 DIAGNOSIS — R45851 Suicidal ideations: Secondary | ICD-10-CM

## 2014-04-24 DIAGNOSIS — F32A Depression, unspecified: Secondary | ICD-10-CM

## 2014-04-24 DIAGNOSIS — Z720 Tobacco use: Secondary | ICD-10-CM | POA: Insufficient documentation

## 2014-04-24 DIAGNOSIS — Z046 Encounter for general psychiatric examination, requested by authority: Secondary | ICD-10-CM

## 2014-04-24 DIAGNOSIS — Z880 Allergy status to penicillin: Secondary | ICD-10-CM | POA: Insufficient documentation

## 2014-04-24 DIAGNOSIS — Z7952 Long term (current) use of systemic steroids: Secondary | ICD-10-CM | POA: Insufficient documentation

## 2014-04-24 LAB — CBC WITH DIFFERENTIAL/PLATELET
Basophils Absolute: 0 10*3/uL (ref 0.0–0.1)
Basophils Relative: 0 % (ref 0–1)
EOS PCT: 0 % (ref 0–5)
Eosinophils Absolute: 0 10*3/uL (ref 0.0–0.7)
HEMATOCRIT: 46.6 % (ref 39.0–52.0)
Hemoglobin: 15.7 g/dL (ref 13.0–17.0)
LYMPHS ABS: 2.7 10*3/uL (ref 0.7–4.0)
Lymphocytes Relative: 33 % (ref 12–46)
MCH: 29.1 pg (ref 26.0–34.0)
MCHC: 33.7 g/dL (ref 30.0–36.0)
MCV: 86.3 fL (ref 78.0–100.0)
Monocytes Absolute: 0.5 10*3/uL (ref 0.1–1.0)
Monocytes Relative: 5 % (ref 3–12)
NEUTROS PCT: 62 % (ref 43–77)
Neutro Abs: 5.1 10*3/uL (ref 1.7–7.7)
Platelets: 257 10*3/uL (ref 150–400)
RBC: 5.4 MIL/uL (ref 4.22–5.81)
RDW: 13.1 % (ref 11.5–15.5)
WBC: 8.4 10*3/uL (ref 4.0–10.5)

## 2014-04-24 LAB — COMPREHENSIVE METABOLIC PANEL
ALK PHOS: 45 U/L (ref 39–117)
ALT: 19 U/L (ref 0–53)
AST: 17 U/L (ref 0–37)
Albumin: 4 g/dL (ref 3.5–5.2)
Anion gap: 14 (ref 5–15)
BILIRUBIN TOTAL: 0.6 mg/dL (ref 0.3–1.2)
BUN: 12 mg/dL (ref 6–23)
CO2: 24 mEq/L (ref 19–32)
Calcium: 9.2 mg/dL (ref 8.4–10.5)
Chloride: 101 mEq/L (ref 96–112)
Creatinine, Ser: 1.24 mg/dL (ref 0.50–1.35)
GFR calc non Af Amer: 75 mL/min — ABNORMAL LOW (ref >90)
GFR, EST AFRICAN AMERICAN: 87 mL/min — AB (ref >90)
Glucose, Bld: 105 mg/dL — ABNORMAL HIGH (ref 70–99)
POTASSIUM: 3.7 meq/L (ref 3.7–5.3)
Sodium: 139 mEq/L (ref 137–147)
TOTAL PROTEIN: 7.3 g/dL (ref 6.0–8.3)

## 2014-04-24 LAB — ETHANOL: Alcohol, Ethyl (B): <11 mg/dL (ref 0–11)

## 2014-04-24 MED ORDER — ONDANSETRON HCL 4 MG PO TABS
4.0000 mg | ORAL_TABLET | Freq: Three times a day (TID) | ORAL | Status: DC | PRN
Start: 2014-04-24 — End: 2014-04-25

## 2014-04-24 MED ORDER — IBUPROFEN 200 MG PO TABS
600.0000 mg | ORAL_TABLET | Freq: Three times a day (TID) | ORAL | Status: DC | PRN
Start: 2014-04-24 — End: 2014-04-25

## 2014-04-24 MED ORDER — LORAZEPAM 1 MG PO TABS: 1.0000 mg | ORAL_TABLET | Freq: Three times a day (TID) | ORAL | Status: DC | PRN

## 2014-04-24 MED ORDER — NICOTINE 21 MG/24HR TD PT24: 21.0000 mg | MEDICATED_PATCH | Freq: Every day | TRANSDERMAL | Status: DC

## 2014-04-24 MED ORDER — ACETAMINOPHEN 325 MG PO TABS
650.0000 mg | ORAL_TABLET | ORAL | Status: DC | PRN
Start: 2014-04-24 — End: 2014-04-25

## 2014-04-24 MED ORDER — ZOLPIDEM TARTRATE 5 MG PO TABS: 5.0000 mg | ORAL_TABLET | Freq: Every evening | ORAL | Status: DC | PRN
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
## 2014-04-24 NOTE — ED Provider Notes (Signed)
CSN: [id number]     Arrival date & time 04/24/14  1847 History  This chart was scribed for a non-physician practitioner, Arthor CaptainAbigail Jams Trickett, PA-C working with Linwood DibblesJon Knapp, MD by [place]Jordan Peace, ED Scribe. The patient was seen in [facility]. The patient's care was started at 7:18 PM.    Chief Complaint  Patient presents with  . IVC       The history is provided by the patient. No language interpreter was used.   HPI Comments: Patrick [name] is a 33 y.o. male who presents to the Emergency for IVC paperwork. Pt states that he recently moved back here to [place] after being in [place] for 2 years. He states that when he got back, he was incarcerated for 4 months until he was released on Sept. 16. Pt states he has had a full time job since Oct. 1st. He states he is trying to get back on his medication but the wait to be seen is too long. He states his mother feels he is very depressed. Denies SI or HI. Pt adds that he has 6 kids, a lot of bills to pay, and a lot of things he is dealing with but states he has no reason to kill himself.   Past Medical History  Diagnosis Date  . Bipolar 1 disorder, depressed    Past Surgical History  Procedure Laterality Date  . Mandible fracture surgery  1996   Family History  Problem Relation Age of Onset  . Cancer Mother   . Diabetes Mother   . Hypertension Mother    History  Substance Use Topics  . Smoking status: Current Every Day Smoker -- 1.00 packs/day for 10 years    Types: Cigarettes  . Smokeless tobacco: Not on file  . Alcohol Use: No    Review of Systems  Psychiatric/Behavioral: Negative for suicidal ideas.  All other systems reviewed and are negative.     Allergies  Penicillins  Home Medications   Prior to Admission medications   Medication Sig Start Date End Date Taking? Authorizing Provider  predniSONE (DELTASONE) 50 MG tablet Take 1 tablet (50 mg total) by mouth daily. 04/22/14  Yes Beau FannyJohn C Withrow, FNP  traMADol (ULTRAM) 50 MG tablet Take  1 tablet (50 mg total) by mouth at bedtime as needed (cough). 04/15/14  Yes Rodolph BongEvan S Corey, MD   BP 107/56 mmHg  Pulse 72  Temp(Src) 99 F (37.2 C) (Oral)  Resp 18  SpO2 100% Physical Exam  Nursing note and vitals reviewed. Constitutional: He is oriented to person, place, and time. He appears well-developed and well-nourished. No distress.  HENT:  Head: Normocephalic and atraumatic.  Eyes: Conjunctivae and EOM are normal.  Neck: Neck supple. No tracheal deviation present.  Cardiovascular: Normal rate.   Pulmonary/Chest: Effort normal. No respiratory distress.  Musculoskeletal: Normal range of motion.  Neurological: He is alert and oriented to person, place, and time.  Skin: Skin is warm and dry.  Psychiatric: He has a normal mood and affect. His behavior is normal.    ED Course  Procedures (including critical care time) Labs Review Labs Reviewed  COMPREHENSIVE METABOLIC PANEL - Abnormal; Notable for the following:    Glucose, Bld 105 (*)    GFR calc non Af Amer 75 (*)    GFR calc Af Amer 87 (*)    All other components within normal limits  CBC WITH DIFFERENTIAL  ETHANOL    Imaging Review No results found.   EKG Interpretation None  Medications - No data to display  7:23 PM- Treatment plan was discussed with patient who verbalizes understanding and agrees.   MDM   Final diagnoses:  Involuntary commitment  Depression    Patient under IVC. He appears medically clear for evaluation.  I personally performed the services described in this documentation, which was scribed in my presence. The recorded information has been reviewed and is accurate.   Arthor Captainbigail Rozanna Cormany, PA-C 04/28/14 2011
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
## 2014-04-24 NOTE — ED Notes (Signed)
One pt belonging bag

## 2014-04-25 ENCOUNTER — Encounter (HOSPITAL_COMMUNITY): Payer: Self-pay | Admitting: *Deleted

## 2014-04-25 DIAGNOSIS — F329 Major depressive disorder, single episode, unspecified: Secondary | ICD-10-CM | POA: Diagnosis present

## 2014-04-25 DIAGNOSIS — R45851 Suicidal ideations: Secondary | ICD-10-CM

## 2014-04-25 DIAGNOSIS — F32A Depression, unspecified: Secondary | ICD-10-CM | POA: Diagnosis present

## 2014-04-25 NOTE — Consult Note (Signed)
Face to face evaluation and I agree with this note 
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
## 2014-04-25 NOTE — [facility] Suicide Risk Assessment (Cosign Needed)
Suicide Risk Assessment  Discharge Assessment     Demographic Factors:  Male  Total Time spent with patient: 30 minutes  Psychiatric Specialty Exam:     Blood pressure 114/64, pulse 64, temperature 97.6 F (36.4 C), temperature source Oral, resp. rate 18, SpO2 100.00%.There is no weight on file to calculate BMI.   General Appearance: Casual   Eye Contact:: Good   Speech: Blocked and Normal Rate   Volume: Normal   Mood: Depressed   Affect: Congruent   Thought Process: Circumstantial and Goal Directed   Orientation: Full (Time, Place, and Person)   Thought Content: Rumination   Suicidal Thoughts: No   Homicidal Thoughts: No   Memory: Immediate; Good  Recent; Good  Remote; Good   Judgement: Intact   Insight: Present   Psychomotor Activity: Normal   Concentration: Fair   Recall: Good   Fund of Knowledge:Good   Language: Good   Akathisia: No   Handed: Right   AIMS (if indicated):   Assets: Manufacturing systems engineerCommunication Skills  Housing  Physical Health  Resilience  Social Support   Sleep:   Musculoskeletal:  Strength & Muscle Tone: within normal limits  Gait & Station: normal  Patient leans: N/A    Mental Status Per Nursing Assessment::   On Admission:     Current Mental Status by Physician: Patient denies suicidal/homicidal ideation, psychosis, paranoia  Loss Factors: NA  Historical Factors: NA  Risk Reduction Factors:   Sense of responsibility to family, Employed and Positive social support  Continued Clinical Symptoms:  Depression  Cognitive Features That Contribute To Risk:  None noted    Suicide Risk:  Minimal: No identifiable suicidal ideation.  Patients presenting with no risk factors but with morbid ruminations; may be classified as minimal risk based on the severity of the depressive symptoms  Discharge Diagnoses:  AXIS I: Substance Abuse and Substance Induced Mood Disorder  AXIS II: Deferred  AXIS III:  Past Medical History   Diagnosis  Date   .   Bipolar 1 disorder, depressed     AXIS IV: other psychosocial or environmental problems and problems related to social environment  AXIS V: 61-70 mild symptoms  Plan Of Care/Follow-up recommendations:  Activity:  as tolerated Diet:  as tolerated   Is patient on multiple antipsychotic therapies at discharge:  No   Has Patient had three or more failed trials of antipsychotic monotherapy by history:  No  Recommended Plan for Multiple Antipsychotic Therapies: NA    Rankin, Shuvon, FNP-BC 04/25/2014, 11:45 AM

## 2014-04-25 NOTE — Progress Notes (Signed)
[facility] Stacy,  Provided pt with a [employer] Orange Card application, highlighting [facility], to help patient with primary care and outpatient mental health resources.

## 2014-04-25 NOTE — Discharge Instructions (Signed)

## 2014-04-25 NOTE — BH Assessment (Signed)
Dr. Ladona Ridgelaylor and Assunta Patrick Rankin, NP evaluated patient and discharge home was recommended. Patient given a list of outpatient referrals.

## 2014-05-30 ENCOUNTER — Emergency Department (HOSPITAL_BASED_OUTPATIENT_CLINIC_OR_DEPARTMENT_OTHER)
Admission: EM | Admit: 2014-05-30 | Discharge: 2014-05-30 | Disposition: A | Discharge status: Home / Self Care | Payer: No Typology Code available for payment source | Attending: Emergency Medicine | Admitting: Emergency Medicine

## 2014-05-30 ENCOUNTER — Encounter (HOSPITAL_BASED_OUTPATIENT_CLINIC_OR_DEPARTMENT_OTHER): Payer: Self-pay | Admitting: *Deleted

## 2014-05-30 DIAGNOSIS — Z880 Allergy status to penicillin: Secondary | ICD-10-CM | POA: Insufficient documentation

## 2014-05-30 DIAGNOSIS — Y92410 Unspecified street and highway as the place of occurrence of the external cause: Secondary | ICD-10-CM | POA: Insufficient documentation

## 2014-05-30 DIAGNOSIS — F319 Bipolar disorder, unspecified: Secondary | ICD-10-CM | POA: Insufficient documentation

## 2014-05-30 DIAGNOSIS — S3992XA Unspecified injury of lower back, initial encounter: Secondary | ICD-10-CM | POA: Insufficient documentation

## 2014-05-30 DIAGNOSIS — Z720 Tobacco use: Secondary | ICD-10-CM | POA: Insufficient documentation

## 2014-05-30 DIAGNOSIS — Y998 Other external cause status: Secondary | ICD-10-CM | POA: Insufficient documentation

## 2014-05-30 DIAGNOSIS — S161XXA Strain of muscle, fascia and tendon at neck level, initial encounter: Secondary | ICD-10-CM

## 2014-05-30 DIAGNOSIS — Y9389 Activity, other specified: Secondary | ICD-10-CM | POA: Insufficient documentation

## 2014-05-30 DIAGNOSIS — Z7952 Long term (current) use of systemic steroids: Secondary | ICD-10-CM | POA: Insufficient documentation

## 2014-05-30 MED ORDER — OXYCODONE-ACETAMINOPHEN 5-325 MG PO TABS
2.0000 | ORAL_TABLET | Freq: Once | ORAL | Status: AC
  Administered 2014-05-30: 2 via ORAL
  Filled 2014-05-30: qty 2

## 2014-05-30 MED ORDER — OXYCODONE-ACETAMINOPHEN 5-325 MG PO TABS: 2.0000 | ORAL_TABLET | Freq: Four times a day (QID) | ORAL | Status: DC | PRN
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
## 2014-08-28 ENCOUNTER — Encounter (HOSPITAL_BASED_OUTPATIENT_CLINIC_OR_DEPARTMENT_OTHER): Payer: Self-pay | Admitting: *Deleted

## 2014-08-28 ENCOUNTER — Emergency Department (HOSPITAL_BASED_OUTPATIENT_CLINIC_OR_DEPARTMENT_OTHER)
Admission: EM | Admit: 2014-08-28 | Discharge: 2014-08-28 | Disposition: A | Discharge status: Home / Self Care | Payer: Self-pay | Attending: Emergency Medicine | Admitting: Emergency Medicine

## 2014-08-28 DIAGNOSIS — Z720 Tobacco use: Secondary | ICD-10-CM | POA: Insufficient documentation

## 2014-08-28 DIAGNOSIS — F319 Bipolar disorder, unspecified: Secondary | ICD-10-CM | POA: Insufficient documentation

## 2014-08-28 DIAGNOSIS — Z880 Allergy status to penicillin: Secondary | ICD-10-CM | POA: Insufficient documentation

## 2014-08-28 DIAGNOSIS — R0981 Nasal congestion: Secondary | ICD-10-CM | POA: Insufficient documentation

## 2014-08-28 DIAGNOSIS — H9202 Otalgia, left ear: Secondary | ICD-10-CM | POA: Insufficient documentation

## 2014-08-28 DIAGNOSIS — Z7952 Long term (current) use of systemic steroids: Secondary | ICD-10-CM | POA: Insufficient documentation

## 2014-08-28 DIAGNOSIS — Z79899 Other long term (current) drug therapy: Secondary | ICD-10-CM | POA: Insufficient documentation

## 2014-08-28 MED ORDER — GUAIFENESIN ER 600 MG PO TB12: 1200.0000 mg | ORAL_TABLET | Freq: Two times a day (BID) | ORAL | Status: DC

## 2014-08-28 MED ORDER — FLUTICASONE PROPIONATE 50 MCG/ACT NA SUSP: 2.0000 | Freq: Every day | NASAL | Status: DC

## 2014-08-28 NOTE — Discharge Instructions (Signed)
Use flonase as prescribed along with mucinex. Stay well hydrated.  Otalgia The most common reason for this in children is an infection of the middle ear. Pain from the middle ear is usually caused by a build-up of fluid and pressure behind the eardrum. Pain from an earache can be sharp, dull, or burning. The pain may be temporary or constant. The middle ear is connected to the nasal passages by a short narrow tube called the Eustachian tube. The Eustachian tube allows fluid to drain out of the middle ear, and helps keep the pressure in your ear equalized. CAUSES  A cold or allergy can block the Eustachian tube with inflammation and the build-up of secretions. This is especially likely in small children, because their Eustachian tube is shorter and more horizontal. When the Eustachian tube closes, the normal flow of fluid from the middle ear is stopped. Fluid can accumulate and cause stuffiness, pain, hearing loss, and an ear infection if germs start growing in this area. SYMPTOMS  The symptoms of an ear infection may include fever, ear pain, fussiness, increased crying, and irritability. Many children will have temporary and minor hearing loss during and right after an ear infection. Permanent hearing loss is rare, but the risk increases the more infections a child has. Other causes of ear pain include retained water in the outer ear canal from swimming and bathing. Ear pain in adults is less likely to be from an ear infection. Ear pain may be referred from other locations. Referred pain may be from the joint between your jaw and the skull. It may also come from a tooth problem or problems in the neck. Other causes of ear pain include:  A foreign body in the ear.  Outer ear infection.  Sinus infections.  Impacted ear wax.  Ear injury.  Arthritis of the jaw or TMJ problems.  Middle ear infection.  Tooth infections.  Sore throat with pain to the ears. DIAGNOSIS  Your caregiver can usually  make the diagnosis by examining you. Sometimes other special studies, including x-rays and lab work may be necessary. TREATMENT   If antibiotics were prescribed, use them as directed and finish them even if you or your child's symptoms seem to be improved.  Sometimes PE tubes are needed in children. These are little plastic tubes which are put into the eardrum during a simple surgical procedure. They allow fluid to drain easier and allow the pressure in the middle ear to equalize. This helps relieve the ear pain caused by pressure changes. HOME CARE INSTRUCTIONS   Only take over-the-counter or prescription medicines for pain, discomfort, or fever as directed by your caregiver. DO NOT GIVE CHILDREN ASPIRIN because of the association of Reye's Syndrome in children taking aspirin.  Use a cold pack applied to the outer ear for 15-20 minutes, 03-04 times per day or as needed may reduce pain. Do not apply ice directly to the skin. You may cause frost bite.  Over-the-counter ear drops used as directed may be effective. Your caregiver may sometimes prescribe ear drops.  Resting in an upright position may help reduce pressure in the middle ear and relieve pain.  Ear pain caused by rapidly descending from high altitudes can be relieved by swallowing or chewing gum. Allowing infants to suck on a bottle during airplane travel can help.  Do not smoke in the house or near children. If you are unable to quit smoking, smoke outside.  Control allergies. SEEK IMMEDIATE MEDICAL CARE IF:  You or your child are becoming sicker.  Pain or fever relief is not obtained with medicine.  You or your child's symptoms (pain, fever, or irritability) do not improve within 24 to 48 hours or as instructed.  Severe pain suddenly stops hurting. This may indicate a ruptured eardrum.  You or your children develop new problems such as severe headaches, stiff neck, difficulty swallowing, or swelling of the face or around  the ear. Document Released: 01/30/2004 Document Revised: 09/06/2011 Document Reviewed: 06/05/2008 [facility] Patient Information 2015 [place], [place]. This information is not intended to replace advice given to you by your health care provider. Make sure you discuss any questions you have with your health care provider.

## 2014-08-28 NOTE — ED Provider Notes (Signed)
CSN: [id number]     Arrival date & time 08/28/14  1633 History   First MD Initiated Contact with Patient 08/28/14 1642     Chief Complaint  Patient presents with  . Otalgia     (Consider location/radiation/quality/duration/timing/severity/associated sxs/prior Treatment) HPI Comments: 34 year old male complaining of left ear pain, nasal congestion, sinus pressure and nonproductive cough 1 week. States hearing from his left ear is muffled. 3 weeks ago he had a stomach flu and had fevers at that time, no further fevers. No aggravating or alleviating factors. No nausea or vomiting.  Patient is a 34 y.o. male presenting with ear pain. The history is provided by the patient.  Otalgia Associated symptoms: congestion and cough     Past Medical History  Diagnosis Date  . Bipolar 1 disorder, depressed    Past Surgical History  Procedure Laterality Date  . Mandible fracture surgery  1996   Family History  Problem Relation Age of Onset  . Cancer Mother   . Diabetes Mother   . Hypertension Mother    History  Substance Use Topics  . Smoking status: Current Every Day Smoker -- 1.00 packs/day for 10 years    Types: Cigarettes  . Smokeless tobacco: Not on file  . Alcohol Use: Yes    Review of Systems  HENT: Positive for congestion, ear pain and sinus pressure.   Respiratory: Positive for cough.   All other systems reviewed and are negative.     Allergies  Penicillins  Home Medications   Prior to Admission medications   Medication Sig Start Date End Date Taking? Authorizing Provider  fluticasone (FLONASE) 50 MCG/ACT nasal spray Place 2 sprays into both nostrils daily. 08/28/14   Aadhira Heffernan M Joslyn Ramos, PA-C  guaiFENesin (MUCINEX) 600 MG 12 hr tablet Take 2 tablets (1,200 mg total) by mouth 2 (two) times daily. 08/28/14   Kathrynn Speedobyn M Daevion Navarette, PA-C  oxyCODONE-acetaminophen (PERCOCET/ROXICET) 5-325 MG per tablet Take 2 tablets by mouth every 6 (six) hours as needed for severe pain. 05/30/14   Roxy Horsemanobert  Browning, PA-C  predniSONE (DELTASONE) 50 MG tablet Take 1 tablet (50 mg total) by mouth daily. 04/22/14   Beau FannyJohn C Withrow, FNP  traMADol (ULTRAM) 50 MG tablet Take 1 tablet (50 mg total) by mouth at bedtime as needed (cough). 04/15/14   Rodolph BongEvan S Corey, MD   BP 122/79 mmHg  Pulse 76  Temp(Src) 98.6 F (37 C) (Oral)  Resp 18  Ht 5\' 11"  (1.803 m)  Wt 190 lb (86.183 kg)  BMI 26.51 kg/m2  SpO2 97% Physical Exam  Constitutional: He is oriented to person, place, and time. He appears well-developed and well-nourished. No distress.  HENT:  Head: Normocephalic and atraumatic.  Mouth/Throat: Oropharynx is clear and moist.  BL TM retracted. No erythema, injection, drainage. Nasal congestion, mucosal edema.  Eyes: Conjunctivae and EOM are normal.  Neck: Normal range of motion. Neck supple.  Cardiovascular: Normal rate, regular rhythm and normal heart sounds.   Pulmonary/Chest: Effort normal and breath sounds normal.  Musculoskeletal: Normal range of motion. He exhibits no edema.  Lymphadenopathy:    He has no cervical adenopathy.  Neurological: He is alert and oriented to person, place, and time.  Skin: Skin is warm and dry.  Psychiatric: He has a normal mood and affect. His behavior is normal.  Nursing note and vitals reviewed.   ED Course  Procedures (including critical care time) Labs Review Labs Reviewed - No data to display  Imaging Review No results found.  EKG Interpretation None      MDM   Final diagnoses:  Sinus congestion  Otalgia, left   NAD. AF of BSS. Treat with Mucinex, Flonase, nasal saline. Stable for discharge. Return precautions given. Patient states understanding of treatment care plan and is agreeable.  Kathrynn Speed, PA-C 08/28/14 1705  Pricilla Loveless, MD 09/02/14 [phone]

## 2014-08-28 NOTE — ED Notes (Signed)
Pt reports sinus congestion and left ear pain x 1 week

## 2014-10-30 ENCOUNTER — Encounter (HOSPITAL_BASED_OUTPATIENT_CLINIC_OR_DEPARTMENT_OTHER): Payer: Self-pay | Admitting: *Deleted

## 2014-10-30 ENCOUNTER — Emergency Department (HOSPITAL_BASED_OUTPATIENT_CLINIC_OR_DEPARTMENT_OTHER)
Admission: EM | Admit: 2014-10-30 | Discharge: 2014-10-30 | Disposition: A | Discharge status: Home / Self Care | Payer: Self-pay | Attending: Emergency Medicine | Admitting: Emergency Medicine

## 2014-10-30 DIAGNOSIS — Z880 Allergy status to penicillin: Secondary | ICD-10-CM | POA: Insufficient documentation

## 2014-10-30 DIAGNOSIS — Z720 Tobacco use: Secondary | ICD-10-CM | POA: Insufficient documentation

## 2014-10-30 DIAGNOSIS — H109 Unspecified conjunctivitis: Secondary | ICD-10-CM | POA: Insufficient documentation

## 2014-10-30 DIAGNOSIS — Z87828 Personal history of other (healed) physical injury and trauma: Secondary | ICD-10-CM | POA: Insufficient documentation

## 2014-10-30 MED ORDER — CIPROFLOXACIN HCL 0.3 % OP SOLN
1.0000 [drp] | OPHTHALMIC | Status: DC
  Administered 2014-10-30: 1 [drp] via OPHTHALMIC
  Filled 2014-10-30: qty 2.5

## 2014-10-30 MED ORDER — TETRACAINE HCL 0.5 % OP SOLN
2.0000 [drp] | Freq: Once | OPHTHALMIC | Status: AC
  Administered 2014-10-30: 2 [drp] via OPHTHALMIC

## 2014-10-30 MED ORDER — FLUORESCEIN SODIUM 1 MG OP STRP
1.0000 | ORAL_STRIP | Freq: Once | OPHTHALMIC | Status: AC
  Administered 2014-10-30: 1 via OPHTHALMIC

## 2014-10-30 MED ORDER — TETRACAINE HCL 0.5 % OP SOLN
1.0000 [drp] | Freq: Once | OPHTHALMIC | Status: DC
  Filled 2014-10-30: qty 2

## 2014-10-30 MED ORDER — FLUORESCEIN SODIUM 1 MG OP STRP
1.0000 | ORAL_STRIP | Freq: Once | OPHTHALMIC | Status: DC
  Filled 2014-10-30: qty 1

## 2014-10-30 NOTE — Discharge Instructions (Signed)

## 2014-10-30 NOTE — ED Notes (Signed)
C/o right eye pain. States a piece of cardboard hit him in rt eye 2 weeks ago was [facility] and eye was healing okay and this am he rubbed eye and now feels like something is in eye.

## 2014-10-30 NOTE — ED Provider Notes (Signed)
CSN: [id number]     Arrival date & time 10/30/14  0821 History   First MD Initiated Contact with Patient 10/30/14 [phone]     Chief Complaint  Patient presents with  . Eye Problem     (Consider location/radiation/quality/duration/timing/severity/associated sxs/prior Treatment) HPI Comments: Patient presents to the ER for evaluation of right eye pain and irritation. Patient reports that he was hit the high 2 weeks ago by a piece of cardboard. He was seen in another emergency department and told he had an abrasion at 6:00 on the cornea. He was prescribed antibiotic ointment which she has only used occasionally. He reports that it started to feel better, but then started worsening again this morning when he rubbed his eye. He reports that he feels like there is something in the eye.  Patient is a 34 y.o. male presenting with eye problem.  Eye Problem Associated symptoms: redness     History reviewed. No pertinent past medical history. History reviewed. No pertinent past surgical history. No family history on file. History  Substance Use Topics  . Smoking status: Current Every Day Smoker -- 1.00 packs/day    Types: Cigarettes  . Smokeless tobacco: Not on file  . Alcohol Use: Not on file    Review of Systems  Eyes: Positive for pain and redness.  All other systems reviewed and are negative.     Allergies  Penicillins  Home Medications   Prior to Admission medications   Not on File   BP 128/79 mmHg  Pulse 65  Temp(Src) 98.1 F (36.7 C) (Oral)  Resp 18  Ht 5\' 11"  (1.803 m)  Wt 183 lb (83.008 kg)  BMI 25.53 kg/m2  SpO2 99% Physical Exam  Constitutional: He is oriented to person, place, and time. He appears well-developed and well-nourished. No distress.  HENT:  Head: Normocephalic and atraumatic.  Right Ear: Hearing normal.  Left Ear: Hearing normal.  Nose: Nose normal.  Mouth/Throat: Oropharynx is clear and moist and mucous membranes are normal.  Eyes: EOM and lids  are normal. Pupils are equal, round, and reactive to light. Lids are everted and swept, no foreign bodies found. Right eye exhibits no discharge and no exudate. No foreign body present in the right eye. Right conjunctiva is injected. Right conjunctiva has no hemorrhage.  Fluoroscein exam does not reveal any corneal uptake - no abrasion, ulceration, dendritic lesions  Neck: Normal range of motion. Neck supple.  Cardiovascular: Regular rhythm, S1 normal and S2 normal.  Exam reveals no gallop and no friction rub.   No murmur heard. Pulmonary/Chest: Effort normal and breath sounds normal. No respiratory distress. He exhibits no tenderness.  Abdominal: Soft. Normal appearance and bowel sounds are normal. There is no hepatosplenomegaly. There is no tenderness. There is no rebound, no guarding, no tenderness at McBurney's point and negative Murphy's sign. No hernia.  Musculoskeletal: Normal range of motion.  Neurological: He is alert and oriented to person, place, and time. He has normal strength. No cranial nerve deficit or sensory deficit. Coordination normal. GCS eye subscore is 4. GCS verbal subscore is 5. GCS motor subscore is 6.  Skin: Skin is warm, dry and intact. No rash noted. No cyanosis.  Psychiatric: He has a normal mood and affect. His speech is normal and behavior is normal. Thought content normal.  Nursing note and vitals reviewed.   ED Course  Procedures (including critical care time) Labs Review Labs Reviewed - No data to display  Imaging Review No results found.  EKG Interpretation None      MDM   Final diagnoses:  None   conjunctivitis  Patient presents with eye irritation. Patient reports an injury 2 weeks ago, was diagnosed with a corneal abrasion. He has had continued irritation of the eye and then rubbed his eye this morning and now feels like there is an object in the eye. Thorough examination, however, did not reveal any foreign body. Corneal examination is normal.  Patient does have inflammation and injection of the eye, possibly simply irritation. Will switch to Cipro drops, patient has had difficulty with the ointment. Will refer to ophthalmology.    Gilda Creasehristopher J Pollina, MD 10/30/14 [phone]

## 2014-11-04 ENCOUNTER — Emergency Department (HOSPITAL_BASED_OUTPATIENT_CLINIC_OR_DEPARTMENT_OTHER)
Admission: EM | Admit: 2014-11-04 | Discharge: 2014-11-04 | Discharge status: Against Medical Advice | Payer: Self-pay | Attending: Emergency Medicine | Admitting: Emergency Medicine

## 2014-11-04 ENCOUNTER — Encounter (HOSPITAL_BASED_OUTPATIENT_CLINIC_OR_DEPARTMENT_OTHER): Payer: Self-pay

## 2014-11-04 DIAGNOSIS — Z720 Tobacco use: Secondary | ICD-10-CM | POA: Insufficient documentation

## 2014-11-04 DIAGNOSIS — Y998 Other external cause status: Secondary | ICD-10-CM | POA: Insufficient documentation

## 2014-11-04 DIAGNOSIS — Y9389 Activity, other specified: Secondary | ICD-10-CM | POA: Insufficient documentation

## 2014-11-04 DIAGNOSIS — W228XXA Striking against or struck by other objects, initial encounter: Secondary | ICD-10-CM | POA: Insufficient documentation

## 2014-11-04 DIAGNOSIS — Y9289 Other specified places as the place of occurrence of the external cause: Secondary | ICD-10-CM | POA: Insufficient documentation

## 2014-11-04 DIAGNOSIS — S0591XA Unspecified injury of right eye and orbit, initial encounter: Secondary | ICD-10-CM | POA: Insufficient documentation

## 2014-11-04 MED ORDER — FLUORESCEIN SODIUM 1 MG OP STRP: 1.0000 | ORAL_STRIP | Freq: Once | OPHTHALMIC | Status: DC

## 2014-11-04 NOTE — ED Notes (Signed)
Reports redness and drainage from right eye. Hit eye with a cardboard box.

## 2014-11-05 ENCOUNTER — Encounter (HOSPITAL_BASED_OUTPATIENT_CLINIC_OR_DEPARTMENT_OTHER): Payer: Self-pay | Admitting: *Deleted

## 2014-11-05 ENCOUNTER — Emergency Department (HOSPITAL_BASED_OUTPATIENT_CLINIC_OR_DEPARTMENT_OTHER)
Admission: EM | Admit: 2014-11-05 | Discharge: 2014-11-05 | Disposition: A | Discharge status: Home / Self Care | Payer: Self-pay | Attending: Emergency Medicine | Admitting: Emergency Medicine

## 2014-11-05 DIAGNOSIS — S0501XA Injury of conjunctiva and corneal abrasion without foreign body, right eye, initial encounter: Secondary | ICD-10-CM

## 2014-11-05 DIAGNOSIS — Y9389 Activity, other specified: Secondary | ICD-10-CM | POA: Insufficient documentation

## 2014-11-05 DIAGNOSIS — W228XXA Striking against or struck by other objects, initial encounter: Secondary | ICD-10-CM | POA: Insufficient documentation

## 2014-11-05 DIAGNOSIS — Z8659 Personal history of other mental and behavioral disorders: Secondary | ICD-10-CM | POA: Insufficient documentation

## 2014-11-05 DIAGNOSIS — Z79899 Other long term (current) drug therapy: Secondary | ICD-10-CM | POA: Insufficient documentation

## 2014-11-05 DIAGNOSIS — Z720 Tobacco use: Secondary | ICD-10-CM | POA: Insufficient documentation

## 2014-11-05 DIAGNOSIS — Z7951 Long term (current) use of inhaled steroids: Secondary | ICD-10-CM | POA: Insufficient documentation

## 2014-11-05 DIAGNOSIS — Y9289 Other specified places as the place of occurrence of the external cause: Secondary | ICD-10-CM | POA: Insufficient documentation

## 2014-11-05 DIAGNOSIS — Z880 Allergy status to penicillin: Secondary | ICD-10-CM | POA: Insufficient documentation

## 2014-11-05 DIAGNOSIS — Y998 Other external cause status: Secondary | ICD-10-CM | POA: Insufficient documentation

## 2014-11-05 DIAGNOSIS — Z7952 Long term (current) use of systemic steroids: Secondary | ICD-10-CM | POA: Insufficient documentation

## 2014-11-05 MED ORDER — ERYTHROMYCIN 5 MG/GM OP OINT
TOPICAL_OINTMENT | Freq: Four times a day (QID) | OPHTHALMIC | Status: DC
  Administered 2014-11-05: 1 via OPHTHALMIC
  Filled 2014-11-05: qty 3.5

## 2014-11-05 MED ORDER — NAPROXEN 500 MG PO TABS: 500.0000 mg | ORAL_TABLET | Freq: Two times a day (BID) | ORAL | Status: DC

## 2014-11-05 MED ORDER — TETRACAINE HCL 0.5 % OP SOLN
2.0000 [drp] | Freq: Once | OPHTHALMIC | Status: AC
  Administered 2014-11-05: 2 [drp] via OPHTHALMIC
  Filled 2014-11-05: qty 2

## 2014-11-05 MED ORDER — NAPROXEN 250 MG PO TABS
500.0000 mg | ORAL_TABLET | Freq: Once | ORAL | Status: AC
  Administered 2014-11-05: 500 mg via ORAL
  Filled 2014-11-05: qty 2

## 2014-11-05 MED ORDER — FLUORESCEIN SODIUM 1 MG OP STRP
1.0000 | ORAL_STRIP | Freq: Once | OPHTHALMIC | Status: AC
  Administered 2014-11-05: 1 via OPHTHALMIC
  Filled 2014-11-05: qty 1

## 2014-11-05 MED ORDER — ERYTHROMYCIN 5 MG/GM OP OINT: TOPICAL_OINTMENT | OPHTHALMIC | Status: DC

## 2014-11-05 NOTE — ED Notes (Signed)
Slit lamp at bedside.  

## 2014-11-05 NOTE — ED Notes (Signed)
MD at bedside. 

## 2014-11-05 NOTE — Discharge Instructions (Signed)
Corneal Abrasion °The cornea is the clear covering at the front and center of the eye. When looking at the colored portion of the eye (iris), you are looking through the cornea. This very thin tissue is made up of many layers. The surface layer is a single layer of cells (corneal epithelium) and is one of the most sensitive tissues in the body. If a scratch or injury causes the corneal epithelium to come off, it is called a corneal abrasion. If the injury extends to the tissues below the epithelium, the condition is called a corneal ulcer. °CAUSES  °· Scratches. °· Trauma. °· Foreign body in the eye. °Some people have recurrences of abrasions in the area of the original injury even after it has healed (recurrent erosion syndrome). Recurrent erosion syndrome generally improves and goes away with time. °SYMPTOMS  °· Eye pain. °· Difficulty or inability to keep the injured eye open. °· The eye becomes very sensitive to light. °· Recurrent erosions tend to happen suddenly, first thing in the morning, usually after waking up and opening the eye. °DIAGNOSIS  °Your health care provider can diagnose a corneal abrasion during an eye exam. Dye is usually placed in the eye using a drop or a small paper strip moistened by your tears. When the eye is examined with a special light, the abrasion shows up clearly because of the dye. °TREATMENT  °· Small abrasions may be treated with antibiotic drops or ointment alone. °· A pressure patch may be put over the eye. If this is done, follow your doctor's instructions for when to remove the patch. Do not drive or use machines while the eye patch is on. Judging distances is hard to do with a patch on. °If the abrasion becomes infected and spreads to the deeper tissues of the cornea, a corneal ulcer can result. This is serious because it can cause corneal scarring. Corneal scars interfere with light passing through the cornea and cause a loss of vision in the involved eye. °HOME CARE  INSTRUCTIONS °· Use medicine or ointment as directed. Only take over-the-counter or prescription medicines for pain, discomfort, or fever as directed by your health care provider. °· Do not drive or operate machinery if your eye is patched. Your ability to judge distances is impaired. °· If your health care provider has given you a follow-up appointment, it is very important to keep that appointment. Not keeping the appointment could result in a severe eye infection or permanent loss of vision. If there is any problem keeping the appointment, let your health care provider know. °SEEK MEDICAL CARE IF:  °· You have pain, light sensitivity, and a scratchy feeling in one eye or both eyes. °· Your pressure patch keeps loosening up, and you can blink your eye under the patch after treatment. °· Any kind of discharge develops from the eye after treatment or if the lids stick together in the morning. °· You have the same symptoms in the morning as you did with the original abrasion days, weeks, or months after the abrasion healed. °MAKE SURE YOU:  °· Understand these instructions. °· Will watch your condition. °· Will get help right away if you are not doing well or get worse. °Document Released: 06/11/2000 Document Revised: 06/19/2013 Document Reviewed: 02/19/2013 °ExitCare® Patient Information ©2015 [employer]. This information is not intended to replace advice given to you by your health care provider. Make sure you discuss any questions you have with your health care provider. ° °

## 2014-11-05 NOTE — ED Provider Notes (Signed)
CSN: [id number]     Arrival date & time 11/05/14  [id number] History   First MD Initiated Contact with Patient 11/05/14 0845     Chief Complaint  Patient presents with  . Eye Pain    HPI Pt injured his eye month ago.  He was hit in the eye with a piece of cardboard.  He was seen at [facility].  It stopped hurting for about one week and now he feels like something is in his eye.  The pain is in the right eye.  He feels like something might be in the lower lid.   He has not seen anyone since the last ed visit.  No fevers.  No contact lens use. Past Medical History  Diagnosis Date  . Bipolar 1 disorder, depressed    Past Surgical History  Procedure Laterality Date  . Mandible fracture surgery  1996   Family History  Problem Relation Age of Onset  . Cancer Mother   . Diabetes Mother   . Hypertension Mother    History  Substance Use Topics  . Smoking status: Current Every Day Smoker -- 1.00 packs/day for 10 years    Types: Cigarettes  . Smokeless tobacco: Not on file  . Alcohol Use: Yes    Review of Systems  All other systems reviewed and are negative.     Allergies  Penicillins  Home Medications   Prior to Admission medications   Medication Sig Start Date End Date Taking? Authorizing Provider  erythromycin ophthalmic ointment Place a 1/2 inch ribbon of ointment into the lower eyelid q6 11/05/14   Linwood DibblesJon Linsey Arteaga, MD  fluticasone [facility](FLONASE) 50 MCG/ACT nasal spray Place 2 sprays into both nostrils daily. 08/28/14   Robyn M Hess, PA-C  guaiFENesin (MUCINEX) 600 MG 12 hr tablet Take 2 tablets (1,200 mg total) by mouth 2 (two) times daily. 08/28/14   Robyn M Hess, PA-C  naproxen (NAPROSYN) 500 MG tablet Take 1 tablet (500 mg total) by mouth 2 (two) times daily. 11/05/14   Linwood DibblesJon Kees Idrovo, MD  oxyCODONE-acetaminophen (PERCOCET/ROXICET) 5-325 MG per tablet Take 2 tablets by mouth every 6 (six) hours as needed for severe pain. 05/30/14   Roxy Horsemanobert Browning, PA-C  predniSONE (DELTASONE) 50 MG tablet  Take 1 tablet (50 mg total) by mouth daily. 04/22/14   Beau FannyJohn C Withrow, FNP  traMADol (ULTRAM) 50 MG tablet Take 1 tablet (50 mg total) by mouth at bedtime as needed (cough). 04/15/14   Rodolph BongEvan S Corey, MD   BP 126/85 mmHg  Pulse 69  Temp(Src) 98.3 F (36.8 C) (Oral)  Resp 16  Ht 5\' 11"  (1.803 m)  Wt 183 lb (83.008 kg)  BMI 25.53 kg/m2  SpO2 98% Physical Exam  Constitutional: He appears well-developed and well-nourished. No distress.  HENT:  Head: Normocephalic and atraumatic.  Right Ear: External ear normal.  Left Ear: External ear normal.  Eyes: EOM are normal. Pupils are equal, round, and reactive to light. Lids are everted and swept, no foreign bodies found. Right eye exhibits chemosis (frequent tearing of the right eye). Right eye exhibits no discharge and no exudate. Left eye exhibits no discharge. Right conjunctiva is injected. No scleral icterus.  Slit lamp exam:      The right eye shows corneal abrasion and fluorescein uptake. The right eye shows no foreign body and no anterior chamber bulge.    Neck: Neck supple. No tracheal deviation present.  Cardiovascular: Normal rate.   Pulmonary/Chest: Effort normal. No stridor. No respiratory  distress.  Musculoskeletal: He exhibits no edema.  Neurological: He is alert. Cranial nerve deficit: no gross deficits.  Skin: Skin is warm and dry. No rash noted.  Psychiatric: He has a normal mood and affect.  Nursing note and vitals reviewed.   ED Course  Procedures (including critical care time) Labs Review Labs Reviewed - No data to display  Imaging Review No results found.   EKG Interpretation None      MDM   Final diagnoses:  Corneal abrasion, right, initial encounter    The patient has an oval corneal abrasion with fluorescein uptake noted on slit lamp. It is surprising that he has this ulceration 1 month from his initial injury. I am unable to locate any retained foreign body. Plan is to refer him to ophthalmology for  further evaluation. We will irrigate the eye in the emergency department.    Linwood DibblesJon Delynn Olvera, MD 11/05/14 [phone]

## 2014-11-05 NOTE — ED Notes (Signed)
Pt c/o rt eye pain, burning, drainage, states was here yesterday "I had to leave" so left prior to being seen by ED provider.

## 2014-11-05 NOTE — ED Notes (Signed)
Patient preparing for discharge. 

## 2014-11-05 NOTE — ED Notes (Signed)
Presents to ED w/ c/o Rt eye pain, burning with onset approx 1 month ago, states rec medication, states no relief from eye discomfort

## 2014-11-23 ENCOUNTER — Emergency Department (HOSPITAL_BASED_OUTPATIENT_CLINIC_OR_DEPARTMENT_OTHER)
Admission: EM | Admit: 2014-11-23 | Discharge: 2014-11-24 | Disposition: A | Discharge status: Home / Self Care | Payer: Self-pay | Attending: Emergency Medicine | Admitting: Emergency Medicine

## 2014-11-23 ENCOUNTER — Encounter (HOSPITAL_BASED_OUTPATIENT_CLINIC_OR_DEPARTMENT_OTHER): Payer: Self-pay | Admitting: *Deleted

## 2014-11-23 DIAGNOSIS — Z880 Allergy status to penicillin: Secondary | ICD-10-CM | POA: Insufficient documentation

## 2014-11-23 DIAGNOSIS — Z79899 Other long term (current) drug therapy: Secondary | ICD-10-CM | POA: Insufficient documentation

## 2014-11-23 DIAGNOSIS — Z7951 Long term (current) use of inhaled steroids: Secondary | ICD-10-CM | POA: Insufficient documentation

## 2014-11-23 DIAGNOSIS — Z720 Tobacco use: Secondary | ICD-10-CM | POA: Insufficient documentation

## 2014-11-23 DIAGNOSIS — Z7952 Long term (current) use of systemic steroids: Secondary | ICD-10-CM | POA: Insufficient documentation

## 2014-11-23 DIAGNOSIS — B356 Tinea cruris: Secondary | ICD-10-CM | POA: Insufficient documentation

## 2014-11-23 DIAGNOSIS — F319 Bipolar disorder, unspecified: Secondary | ICD-10-CM | POA: Insufficient documentation

## 2014-11-23 MED ORDER — CLOTRIMAZOLE 1 % EX CREA: 1.0000 "application " | TOPICAL_CREAM | Freq: Two times a day (BID) | CUTANEOUS | Status: DC

## 2014-11-23 NOTE — Discharge Instructions (Signed)
Jock Itch Jock itch is a fungal infection of the skin in the groin area. It is sometimes called "ringworm" even though it is not caused by a worm. A fungus is a type of germ that thrives in dark, damp places.  CAUSES  This infection may spread from:  A fungus infection elsewhere on the body (such as athlete's foot).  Sharing towels or clothing. This infection is more common in:  Hot, humid climates.  People who wear tight-fitting clothing or wet bathing suits for long periods of time.  Athletes.  Overweight people.  People with diabetes. SYMPTOMS  Jock itch causes the following symptoms:  Red, pink or brown rash in the groin. Rash may spread to the thighs, anus, and buttocks.  Itching. DIAGNOSIS  Your caregiver may make the diagnosis by looking at the rash. Sometimes a skin scraping will be sent to test for fungus. Testing can be done either by looking under the microscope or by doing a culture (test to try to grow the fungus). A culture can take up to 2 weeks to come back. TREATMENT  Jock itch may be treated with:  Skin cream or ointment to kill fungus.  Medicine by mouth to kill fungus.  Skin cream or ointment to calm the itching.  Compresses or medicated powders to dry the infected skin. HOME CARE INSTRUCTIONS   Be sure to treat the rash completely. Follow your caregiver's instructions. It can take a couple of weeks to treat. If you do not treat the infection long enough, the rash can come back.  Wear loose-fitting clothing.  Men should wear cotton boxer shorts.  Women should wear cotton underwear.  Avoid hot baths.  Dry the groin area well after bathing. SEEK MEDICAL CARE IF:   Your rash is worse.  Your rash is spreading.  Your rash returns after treatment is finished.  Your rash is not gone in 4 weeks. Fungal infections are slow to respond to treatment. Some redness may remain for several weeks after the fungus is gone. SEEK IMMEDIATE MEDICAL CARE  IF:  The area becomes red, warm, tender, and swollen.  You have a fever. Document Released: 06/04/2002 Document Revised: 09/06/2011 Document Reviewed: 05/03/2008 Exit[employer] Patient Information 2015 [employer]. This information is not intended to replace advice given to you by your health care provider. Make sure you discuss any questions you have with your health care provider.  

## 2014-11-23 NOTE — ED Provider Notes (Signed)
CSN: [id number]     Arrival date & time 11/23/14  2316 History   This chart was scribed for Leeandre Nordling, MD by Evon Slackerrance Branch, ED Scribe. This patient was seen in room MH10/MH10 and the patient's care was started at 11:38 PM.     Chief Complaint  Patient presents with  . Rash   Patient is a 34 y.o. male presenting with rash. The history is provided by the patient. No language interpreter was used.  Rash Location:  Ano-genital Ano-genital rash location:  Scrotum and groin Quality: peeling and scaling   Severity:  Mild Onset quality:  Gradual Timing:  Constant Progression:  Worsening Chronicity:  Recurrent Context: not sun exposure   Relieved by:  Nothing Worsened by:  Nothing tried Ineffective treatments:  None tried Associated symptoms: no diarrhea, no fever, no throat swelling, no tongue swelling and not vomiting    HPI Comments: Patrick [name] is a 34 y.o. male who presents to the Emergency Department complaining of worsening rash to left inner thigh onset 2 weeks prior. Pt states that rash has began to spread onto his scrotum. Pt states that the itchiness is worse when sweating or hot. Pt denies any medications PTA. Pt denies fever, vomiting, diarrhea. Pt states that he has had a similar rash in January 2016 that went away with OTC cream.   Past Medical History  Diagnosis Date  . Bipolar 1 disorder, depressed    Past Surgical History  Procedure Laterality Date  . Mandible fracture surgery  1996   Family History  Problem Relation Age of Onset  . Cancer Mother   . Diabetes Mother   . Hypertension Mother    History  Substance Use Topics  . Smoking status: Current Every Day Smoker -- 1.00 packs/day for 10 years    Types: Cigarettes  . Smokeless tobacco: Not on file  . Alcohol Use: Yes    Review of Systems  Constitutional: Negative for fever.  HENT: Negative for facial swelling.   Gastrointestinal: Negative for vomiting and diarrhea.  Skin: Positive for rash.   All other systems reviewed and are negative.    Allergies  Penicillins  Home Medications   Prior to Admission medications   Medication Sig Start Date End Date Taking? Authorizing Provider  erythromycin ophthalmic ointment Place a 1/2 inch ribbon of ointment into the lower eyelid q6 11/05/14   Linwood DibblesJon Knapp, MD  fluticasone [facility](FLONASE) 50 MCG/ACT nasal spray Place 2 sprays into both nostrils daily. 08/28/14   Robyn M Hess, PA-C  guaiFENesin (MUCINEX) 600 MG 12 hr tablet Take 2 tablets (1,200 mg total) by mouth 2 (two) times daily. 08/28/14   Robyn M Hess, PA-C  naproxen (NAPROSYN) 500 MG tablet Take 1 tablet (500 mg total) by mouth 2 (two) times daily. 11/05/14   Linwood DibblesJon Knapp, MD  oxyCODONE-acetaminophen (PERCOCET/ROXICET) 5-325 MG per tablet Take 2 tablets by mouth every 6 (six) hours as needed for severe pain. 05/30/14   Roxy Horsemanobert Browning, PA-C  predniSONE (DELTASONE) 50 MG tablet Take 1 tablet (50 mg total) by mouth daily. 04/22/14   Beau FannyJohn C Withrow, FNP  traMADol (ULTRAM) 50 MG tablet Take 1 tablet (50 mg total) by mouth at bedtime as needed (cough). 04/15/14   Rodolph BongEvan S Corey, MD   BP 121/88 mmHg  Temp(Src) 98.2 F (36.8 C)  Resp 18  Ht 5\' 11"  (1.803 m)  Wt 175 lb (79.379 kg)  BMI 24.42 kg/m2  SpO2 97%   Physical Exam  Constitutional: He is  oriented to person, place, and time. He appears well-developed and well-nourished. No distress.  HENT:  Head: Normocephalic and atraumatic.  Mouth/Throat: Uvula is midline, oropharynx is clear and moist and mucous membranes are normal. No oral lesions. No oropharyngeal exudate.  Eyes: Conjunctivae and EOM are normal. Pupils are equal, round, and reactive to light.  Neck: Normal range of motion. Neck supple. No tracheal deviation present.  Cardiovascular: Normal rate, regular rhythm and normal heart sounds.   Pulmonary/Chest: Effort normal and breath sounds normal. No respiratory distress. He has no wheezes. He has no rales.  Abdominal: Soft. Bowel sounds are  normal. There is no tenderness. There is no rebound and no guarding.  Musculoskeletal: Normal range of motion.  Neurological: He is alert and oriented to person, place, and time. He has normal reflexes.  Skin: Skin is warm and dry. Rash noted.  Flat area of dry skin consistent with tinea cruris.  Chaperone present  Psychiatric: He has a normal mood and affect. His behavior is normal.  Nursing note and vitals reviewed.   ED Course  Procedures (including critical care time) DIAGNOSTIC STUDIES: Oxygen Saturation is 97% on RA, normal by my interpretation.    COORDINATION OF CARE: 11:44 PM-Discussed treatment plan with pt at bedside and pt agreed to plan.     Labs Review Labs Reviewed - No data to display  Imaging Review No results found.   EKG Interpretation None      MDM   Final diagnoses:  None   Tinea cruris:  Miconazole cream.     I personally performed the services described in this documentation, which was scribed in my presence. The recorded information has been reviewed and is accurate.      Cy Blamer, MD 11/24/14 [id number]

## 2014-11-23 NOTE — ED Notes (Addendum)
Pt c/o rash to left inner thigh that started one week ago. C/o itching to area and states it has "spread" to his scrotum. Pt. Does have a concern about possible exposure to herpes. Denies any urinary symptoms or penile d/c. Denies fevers.

## 2014-11-24 ENCOUNTER — Encounter (HOSPITAL_BASED_OUTPATIENT_CLINIC_OR_DEPARTMENT_OTHER): Payer: Self-pay | Admitting: Emergency Medicine

## 2016-02-02 ENCOUNTER — Encounter (HOSPITAL_BASED_OUTPATIENT_CLINIC_OR_DEPARTMENT_OTHER): Payer: Self-pay | Admitting: Emergency Medicine

## 2016-03-22 ENCOUNTER — Emergency Department (HOSPITAL_COMMUNITY)
Admission: EM | Admit: 2016-03-22 | Discharge: 2016-03-22 | Discharge status: Against Medical Advice | Payer: No Typology Code available for payment source

## 2016-03-22 NOTE — ED Notes (Signed)
Called for patient again. Not found in lobby

## 2016-09-11 ENCOUNTER — Encounter (HOSPITAL_COMMUNITY): Payer: Self-pay | Admitting: Emergency Medicine

## 2016-09-11 ENCOUNTER — Emergency Department (HOSPITAL_COMMUNITY)
Admission: EM | Admit: 2016-09-11 | Discharge: 2016-09-11 | Disposition: A | Discharge status: Home / Self Care | Payer: Medicaid - Out of State | Attending: Emergency Medicine | Admitting: Emergency Medicine

## 2016-09-11 ENCOUNTER — Emergency Department (HOSPITAL_COMMUNITY): Payer: Medicaid - Out of State

## 2016-09-11 DIAGNOSIS — R319 Hematuria, unspecified: Secondary | ICD-10-CM | POA: Insufficient documentation

## 2016-09-11 DIAGNOSIS — F17210 Nicotine dependence, cigarettes, uncomplicated: Secondary | ICD-10-CM | POA: Insufficient documentation

## 2016-09-11 DIAGNOSIS — R109 Unspecified abdominal pain: Secondary | ICD-10-CM | POA: Diagnosis present

## 2016-09-11 LAB — I-STAT CHEM 8, ED
BUN: 14 mg/dL (ref 6–20)
Calcium, Ion: 1.14 mmol/L — ABNORMAL LOW (ref 1.15–1.40)
Chloride: 103 mmol/L (ref 101–111)
Creatinine, Ser: 1.2 mg/dL (ref 0.61–1.24)
GLUCOSE: 84 mg/dL (ref 65–99)
HCT: 41 % (ref 39.0–52.0)
Hemoglobin: 13.9 g/dL (ref 13.0–17.0)
Potassium: 4 mmol/L (ref 3.5–5.1)
Sodium: 141 mmol/L (ref 135–145)
TCO2: 26 mmol/L (ref 0–100)

## 2016-09-11 LAB — URINALYSIS, ROUTINE W REFLEX MICROSCOPIC
Bilirubin Urine: NEGATIVE
Glucose, UA: NEGATIVE mg/dL
HGB URINE DIPSTICK: NEGATIVE
Ketones, ur: NEGATIVE mg/dL
Leukocytes, UA: NEGATIVE
NITRITE: NEGATIVE
Protein, ur: NEGATIVE mg/dL
Specific Gravity, Urine: 1.029 (ref 1.005–1.030)
pH: 5 (ref 5.0–8.0)

## 2016-09-11 LAB — CBC WITH DIFFERENTIAL/PLATELET
BASOS ABS: 0 10*3/uL (ref 0.0–0.1)
Basophils Relative: 1 %
EOS PCT: 2 %
Eosinophils Absolute: 0.1 10*3/uL (ref 0.0–0.7)
HCT: 39.1 % (ref 39.0–52.0)
Hemoglobin: 13.4 g/dL (ref 13.0–17.0)
Lymphocytes Relative: 39 %
Lymphs Abs: 2.9 10*3/uL (ref 0.7–4.0)
MCH: 29.7 pg (ref 26.0–34.0)
MCHC: 34.3 g/dL (ref 30.0–36.0)
MCV: 86.7 fL (ref 78.0–100.0)
Monocytes Absolute: 0.5 10*3/uL (ref 0.1–1.0)
Monocytes Relative: 7 %
NEUTROS PCT: 51 %
Neutro Abs: 3.8 10*3/uL (ref 1.7–7.7)
PLATELETS: 246 10*3/uL (ref 150–400)
RBC: 4.51 MIL/uL (ref 4.22–5.81)
RDW: 13.7 % (ref 11.5–15.5)
WBC: 7.3 10*3/uL (ref 4.0–10.5)

## 2016-09-11 NOTE — ED Notes (Signed)
Pt is aware that a urine sample is needed and has a urinal at the bedside 

## 2016-09-11 NOTE — ED Triage Notes (Signed)
Patient here from home with complaints of abdominal pain radiating around to back. Reports blood in the urine also. Denies n/v/d.

## 2016-09-11 NOTE — ED Provider Notes (Signed)
[facility]-EMERGENCY DEPT Provider Note   CSN: [id number] Arrival date & time: 09/11/16  1339     History   Chief Complaint Chief Complaint  Patient presents with  . Abdominal Pain  . Hematuria    HPI Patrick [name] is a 36 y.o. male.  HPI Patient presents with abdominal back pain. Side of the last couple weeks. States he's also had blood in the urine including passing blood clots. States he has some difficulty urinating at times. No fevers. States pain in his lower abdomen at time goes back to his flanks. States "sides. No other bleeding. States he had some blood in the urine around 10 years ago and was told me about an STD. He never followed up with urology at that time.   Past Medical History:  Diagnosis Date  . Bipolar 1 disorder, depressed (HCC)     Patient Active Problem List   Diagnosis Date Noted  . Suicidal ideation 04/25/2014  . Depression 04/25/2014  . Bipolar 1 disorder (HCC) 04/21/2014  . NECK PAIN 07/15/2010  . BACK PAIN 07/15/2010    Past Surgical History:  Procedure Laterality Date  . MANDIBLE FRACTURE SURGERY  1996       Home Medications    Prior to Admission medications   Medication Sig Start Date End Date Taking? Authorizing Provider  clindamycin (CLEOCIN) 300 MG capsule Take 300 mg by mouth 3 (three) times daily.   Yes Historical Provider, MD  erythromycin ophthalmic ointment Place a 1/2 inch ribbon of ointment into the lower eyelid q6 Patient not taking: Reported on 09/11/2016 11/05/14   Linwood DibblesJon Knapp, MD  fluticasone [facility](FLONASE) 50 MCG/ACT nasal spray Place 2 sprays into both nostrils daily. Patient not taking: Reported on 09/11/2016 08/28/14   Kathrynn Speedobyn M Hess, PA-C    Family History Family History  Problem Relation Age of Onset  . Cancer Mother   . Diabetes Mother   . Hypertension Mother     Social History Social History  Substance Use Topics  . Smoking status: Current Every Day Smoker    Packs/day: 1.00    Years: 10.00    Types: Cigarettes    . Smokeless tobacco: Never Used  . Alcohol use Yes     Allergies   Penicillins and Penicillins   Review of Systems Review of Systems  Constitutional: Negative for appetite change.  HENT: Negative for congestion.   Respiratory: Negative for shortness of breath.   Gastrointestinal: Positive for abdominal pain. Negative for blood in stool.  Endocrine: Negative for polyuria.  Genitourinary: Positive for flank pain and hematuria. Negative for penile pain.  Musculoskeletal: Positive for back pain.  Neurological: Negative for headaches.  Hematological: Does not bruise/bleed easily.  Psychiatric/Behavioral: Negative for confusion.     Physical Exam Updated Vital Signs BP 122/70 (BP Location: Left Arm)   Pulse 61   Temp 98.7 F (37.1 C) (Oral)   Resp 16   SpO2 100%   Physical Exam  Constitutional: He appears well-developed.  HENT:  Head: Atraumatic.  Eyes: EOM are normal.  Neck: Neck supple.  Cardiovascular: Normal rate.   Pulmonary/Chest: Effort normal.  Abdominal: Soft.  Genitourinary:  Genitourinary Comments: No testicular tenderness. No hernias palpated. No perineal tenderness.  Musculoskeletal: He exhibits no edema.  Neurological: He is alert.  Skin: Skin is warm. Capillary refill takes less than 2 seconds.     ED Treatments / Results  Labs (all labs ordered are listed, but only abnormal results are displayed) Labs Reviewed  I-STAT CHEM  8, ED - Abnormal; Notable for the following:       Result Value   Calcium, Ion 1.14 (*)    All other components within normal limits  CBC WITH DIFFERENTIAL/PLATELET  URINALYSIS, ROUTINE W REFLEX MICROSCOPIC    EKG  EKG Interpretation None       Radiology Ct Renal Stone Study  Result Date: 09/11/2016 CLINICAL DATA:  36 year old male with history of hematuria. Diarrhea and constipation for 1 week. EXAM: CT ABDOMEN AND PELVIS WITHOUT CONTRAST TECHNIQUE: Multidetector CT imaging of the abdomen and pelvis was performed  following the standard protocol without IV contrast. COMPARISON:  No priors. FINDINGS: Lower chest: Unremarkable. Hepatobiliary: No definite cystic or solid hepatic lesions are identified on today's noncontrast CT examination. Unenhanced appearance of the gallbladder is normal. Pancreas: No pancreatic mass or peripancreatic inflammatory changes are noted on today's noncontrast CT examination. Spleen: Unremarkable. Adrenals/Urinary Tract: Horseshoe kidney (normal anatomical variant) incidentally noted. No calcifications are identified in the collecting systems, along the course of either ureter, or within the lumen of the urinary bladder. There is no hydroureteronephrosis. Unenhanced appearance of the urinary bladder is unremarkable. Stomach/Bowel: Unenhanced appearance of the stomach is normal. There is no pathologic dilatation of small bowel or colon. Normal appendix. Vascular/Lymphatic: No atherosclerotic calcifications or definite aneurysm identified in the abdominal or pelvic vasculature. No lymphadenopathy noted in the abdomen or pelvis on today's noncontrast CT examination. Reproductive: Prostate gland and seminal vesicles are unremarkable in appearance. Other: No significant volume of ascites.  No pneumoperitoneum. Musculoskeletal: There are no aggressive appearing lytic or blastic lesions noted in the visualized portions of the skeleton. IMPRESSION: 1. No acute findings in the abdomen or pelvis. Specifically, no urinary tract calculi no findings of urinary tract obstruction at this time. 2. Horseshoe kidney (normal anatomical variant) incidentally noted. 3. Normal appendix. Electronically Signed   By: Trudie Reed M.D.   On: 09/11/2016 16:26    Procedures Procedures (including critical care time)  Medications Ordered in ED Medications - No data to display   Initial Impression / Assessment and Plan / ED Course  I have reviewed the triage vital signs and the nursing notes.  Pertinent labs &  imaging results that were available during my care of the patient were reviewed by me and considered in my medical decision making (see chart for details).     Patient with reported hematuria. No penile discharge. Urine here does not show infection. CT scan done and does not show stone or other clear cause. Urine also did not show hematuria. Has had this in the past. Will discharge to follow-up with urology.  Final Clinical Impressions(s) / ED Diagnoses   Final diagnoses:  Hematuria, unspecified type    New Prescriptions New Prescriptions   No medications on file     Benjiman Core, MD 09/11/16 1749

## 2017-05-25 ENCOUNTER — Emergency Department (HOSPITAL_COMMUNITY)
Admission: EM | Admit: 2017-05-25 | Discharge: 2017-05-25 | Disposition: A | Discharge status: Home / Self Care | Payer: Medicaid - Out of State | Attending: Emergency Medicine | Admitting: Emergency Medicine

## 2017-05-25 ENCOUNTER — Encounter (HOSPITAL_COMMUNITY): Payer: Self-pay | Admitting: Emergency Medicine

## 2017-05-25 ENCOUNTER — Emergency Department (HOSPITAL_COMMUNITY): Payer: Medicaid - Out of State

## 2017-05-25 DIAGNOSIS — Z23 Encounter for immunization: Secondary | ICD-10-CM | POA: Insufficient documentation

## 2017-05-25 DIAGNOSIS — F17210 Nicotine dependence, cigarettes, uncomplicated: Secondary | ICD-10-CM | POA: Insufficient documentation

## 2017-05-25 DIAGNOSIS — L03114 Cellulitis of left upper limb: Secondary | ICD-10-CM

## 2017-05-25 MED ORDER — CLINDAMYCIN HCL 150 MG PO CAPS: 450.0000 mg | ORAL_CAPSULE | Freq: Three times a day (TID) | ORAL | 0 refills | Status: AC

## 2017-05-25 MED ORDER — KETOROLAC TROMETHAMINE 60 MG/2ML IM SOLN
60.0000 mg | Freq: Once | INTRAMUSCULAR | Status: AC
  Administered 2017-05-25: 60 mg via INTRAMUSCULAR
  Filled 2017-05-25: qty 2

## 2017-05-25 MED ORDER — TETANUS-DIPHTHERIA TOXOIDS TD 5-2 LFU IM INJ
0.5000 mL | INJECTION | Freq: Once | INTRAMUSCULAR | Status: AC
  Administered 2017-05-25: 0.5 mL via INTRAMUSCULAR
  Filled 2017-05-25: qty 0.5

## 2017-05-25 NOTE — ED Provider Notes (Signed)
[facility]-EMERGENCY DEPT Provider Note   CSN: [id number] Arrival date & time: 05/25/17  1358     History   Chief Complaint Chief Complaint  Patient presents with  . Cellulitis    HPI Patrick [name] is a 36 y.o. male with a hx of tobacco abuse and bipolar disorder presents to the ED with complaints of L hand pain and swelling x 9 days. Patient states 1.5 weeks ago he was at work when he cut the knuckle of his second finger with a pocket knife. States rinsed the area and covered it with a paper towel, no issues controlling bleeding. 2 days following started to note discomfort and swelling to the knuckle and dorsum of hand that was mild. Has been applying ice without relief. States 2 days ago pain/swelling significantly worsened with associated erythema. No alleviating/aggravating factors. Sxs progressively worsening. Denies fever, chills, nausea, vomiting, or abdominal pain. Last tetanus was in 2010.   HPI  Past Medical History:  Diagnosis Date  . Bipolar 1 disorder, depressed (HCC)     Patient Active Problem List   Diagnosis Date Noted  . Suicidal ideation 04/25/2014  . Depression 04/25/2014  . Bipolar 1 disorder (HCC) 04/21/2014  . NECK PAIN 07/15/2010  . BACK PAIN 07/15/2010    Past Surgical History:  Procedure Laterality Date  . MANDIBLE FRACTURE SURGERY  1996       Home Medications    Prior to Admission medications   Medication Sig Start Date End Date Taking? Authorizing Provider  clindamycin (CLEOCIN) 300 MG capsule Take 300 mg by mouth 3 (three) times daily.    [provider]  erythromycin ophthalmic ointment Place a 1/2 inch ribbon of ointment into the lower eyelid q6 Patient not taking: Reported on 09/11/2016 11/05/14   Linwood DibblesKnapp, Jon, MD  fluticasone [facility](FLONASE) 50 MCG/ACT nasal spray Place 2 sprays into both nostrils daily. Patient not taking: Reported on 09/11/2016 08/28/14   Kathrynn SpeedHess, Robyn M, PA-C    Family History Family History    Problem Relation Age of Onset  . Cancer Mother   . Diabetes Mother   . Hypertension Mother     Social History Social History   Tobacco Use  . Smoking status: Current Every Day Smoker    Packs/day: 1.00    Years: 10.00    Pack years: 10.00    Types: Cigarettes  . Smokeless tobacco: Never Used  Substance Use Topics  . Alcohol use: Yes  . Drug use: Yes    Frequency: 7.0 times per week    Types: Marijuana    Comment: Denies     Allergies   Penicillins and Penicillins   Review of Systems Review of Systems  Constitutional: Negative for activity change, chills and fever.  HENT: Positive for congestion.   Respiratory: Negative for chest tightness and shortness of breath.   Cardiovascular: Negative for chest pain.  Gastrointestinal: Negative for abdominal pain, diarrhea, nausea and vomiting.  Musculoskeletal:       L hand pain and swelling  Skin: Positive for color change (redness to L hand).  Neurological: Negative for weakness and numbness.  All other systems reviewed and are negative.    Physical Exam Updated Vital Signs BP 133/88 (BP Location: Left Arm)   Pulse 83   Temp 98.4 F (36.9 C) (Oral)   Resp 16   Ht 5\' 11"  (1.803 m)   Wt 74.8 kg (165 lb)   SpO2 98%   BMI 23.01 kg/m   Physical  Exam  Constitutional: He appears well-developed and well-nourished. No distress.  HENT:  Head: Normocephalic and atraumatic.  Eyes: Conjunctivae are normal.  Cardiovascular: Normal rate and regular rhythm.  No murmur heard. Pulses:      Radial pulses are 2+ on the right side, and 2+ on the left side.  Pulmonary/Chest: Breath sounds normal. No respiratory distress. He has no wheezes. He has no rales.  Abdominal: Soft. He exhibits no distension. There is no tenderness.  Musculoskeletal:  Upper extremities: Full ROM with supination, pronation, and wrist flexion/extension. Patient able to make a fist, LUE grip strength difficult to evaluate secondary to pain.  LUE: There  is erythema and swelling to the dorsum of the L hand. Tenderness to palpation over dorsum of the hand extending to distal forearm.  No fluctuance or induration. Digits are nontender to palpation.   Neurological: He is alert.  Clear speech. Upper extremities: sensation grossly intact to bilateral upper extremities. Radial/ulnar/median nerves intact.   Skin: Capillary refill takes less than 2 seconds.  Psychiatric: He has a normal mood and affect. His behavior is normal.  Nursing note and vitals reviewed.   ED Treatments / Results  Labs (all labs ordered are listed, but only abnormal results are displayed) Labs Reviewed - No data to display  EKG  EKG Interpretation None      Radiology Dg Hand Complete Left  Result Date: 05/25/2017 CLINICAL DATA:  Injury, laceration, swelling EXAM: LEFT HAND - COMPLETE 3+ VIEW COMPARISON:  None available FINDINGS: There is no evidence of fracture or dislocation. There is no evidence of arthropathy or other focal bone abnormality. Mild soft tissue swelling of the dorsal surface on the lateral view. No radiopaque foreign body. IMPRESSION: Dorsal hand soft tissue swelling.  No acute osseous finding. Electronically Signed   By: Judie Petit.  Shick M.D.   On: 05/25/2017 18:41    Procedures Procedures (including critical care time)  Medications Ordered in ED Medications  ketorolac (TORADOL) injection 60 mg (60 mg Intramuscular Given 05/25/17 1803)  tetanus & diphtheria toxoids (adult) (TENIVAC) injection 0.5 mL (0.5 mLs Intramuscular Given 05/25/17 1803)     Initial Impression / Assessment and Plan / ED Course  I have reviewed the triage vital signs and the nursing notes.  Pertinent labs & imaging results that were available during my care of the patient were reviewed by me and considered in my medical decision making (see chart for details).   Patient presents with complaint of left hand pain, swelling, redness. Patient is nontoxic appearing, in no apparent  distress, stable vital signs. Exam consistent with cellulitis. Patient has full ROM of all joints of the LUE. X-ray only revealing soft tissue swelling. Patient is not tachycardic, is afebrile, and tolerating PO. There is no gross abscess for which an I&D would be possible. Will treat with Clindamycin. Discussed results, treatment plan, follow up, and return precautions with patient including worsened redness/pain/swelling, fever, or N/V.  Provided opportunity for questions, patient confirmed understanding and is in agreement with plan.  Vitals:   05/25/17 1405 05/25/17 1810  BP: 133/88 (!) 142/88  Pulse: 83 67  Resp: 16 18  Temp: 98.4 F (36.9 C)   SpO2: 98% 98%    Final Clinical Impressions(s) / ED Diagnoses   Final diagnoses:  Cellulitis of left hand    ED Discharge Orders        Ordered    clindamycin (CLEOCIN) 150 MG capsule  3 times daily     05/25/17 1907  Desmond Lopeetrucelli, Cecilie Heidel R, PA-C 05/25/17 1928    Charlynne PanderYao, David Hsienta, MD 05/26/17 2019

## 2017-05-25 NOTE — Discharge Instructions (Signed)
You were seen in the emergency department and diagnosed with cellulitis today.  Your x-ray did not show any fractures.  I have prescribed you clindamycin to treat your cellulitis.  You will need to take 2 tablets 3 times per day.   Please take all of your antibiotics until finished. You may develop abdominal discomfort or diarrhea from the antibiotic.  You may help offset this with probiotics which you can buy at the store (ask your pharmacist if unable to find) or get probiotics in the form of eating yogurt. Do not eat or take the probiotics until 2 hours after your antibiotic. If you are unable to tolerate these side effects follow-up with your primary care provider or return to the emergency department.   If you begin to experience any blistering, rashes, swelling, or difficulty breathing seek medical care for evaluation of potentially more serious side effects.   Take ibuprofen or Tylenol for pain.  Follow-up with your primary care provider or the primary care provider in your discharge instructions within 3 days for reevaluation. Return to the emergency department for any new or worsening symptoms including but not limited to fever, chills, nausea, vomiting, or spreading of redness.

## 2017-05-25 NOTE — ED Triage Notes (Signed)
Patient reports cutting left knuckle with knife last week at work and the past 3-4 days started having erythema and heat.

## 2017-06-30 IMAGING — CT CT RENAL STONE PROTOCOL
2 of 3 series · 16 of 46 positions shown, 18 images · non-contrast
Comparison: No priors.

CLINICAL DATA: 35-year-old male with history of hematuria. Diarrhea
and constipation for 1 week.

EXAM:
CT ABDOMEN AND PELVIS WITHOUT CONTRAST
TECHNIQUE: Multidetector CT imaging of the abdomen and pelvis was performed
following the standard protocol without IV contrast.

[Series 4: lung · axial · 0.66mm/px · z∈[-155,-75]mm · 13 of 46 slices shown, 15 images]
[im 3/46  soft-tissue]
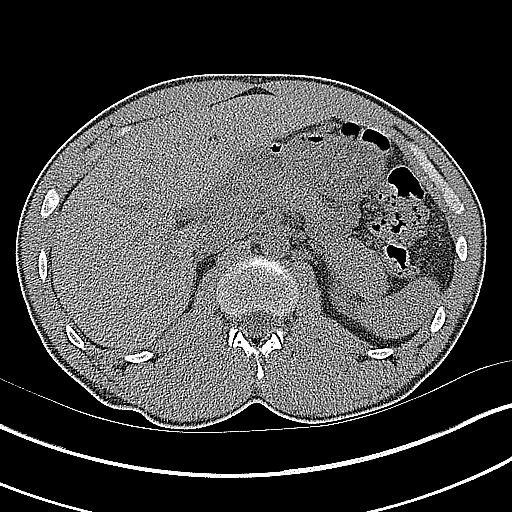
[im 3/46  bone]
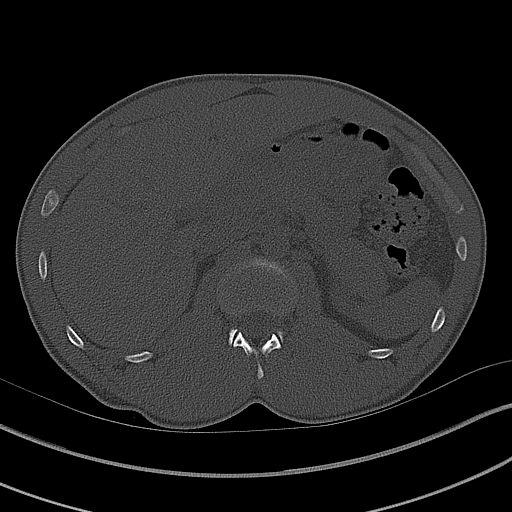
[im 6/46  soft-tissue]
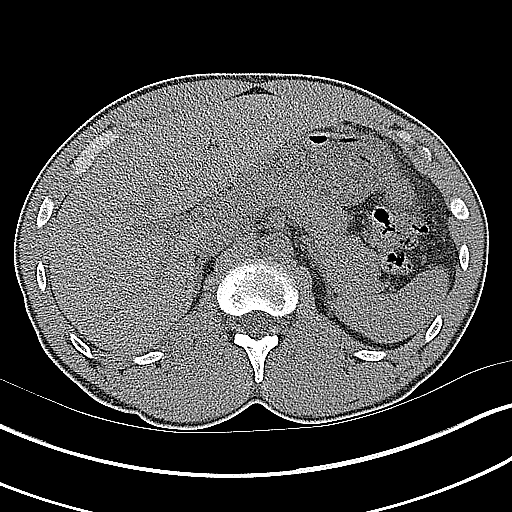
[im 9/46  soft-tissue]
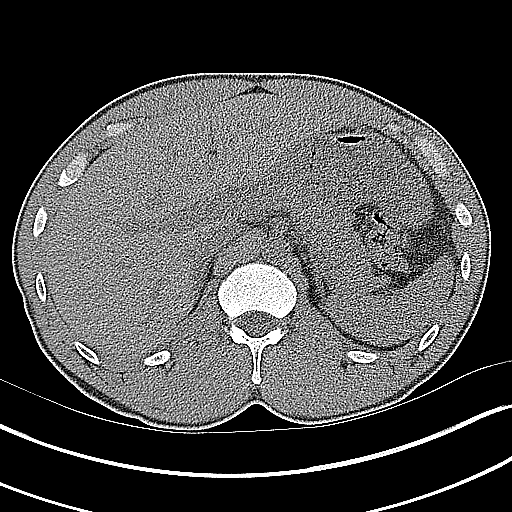
[im 14/46  soft-tissue]
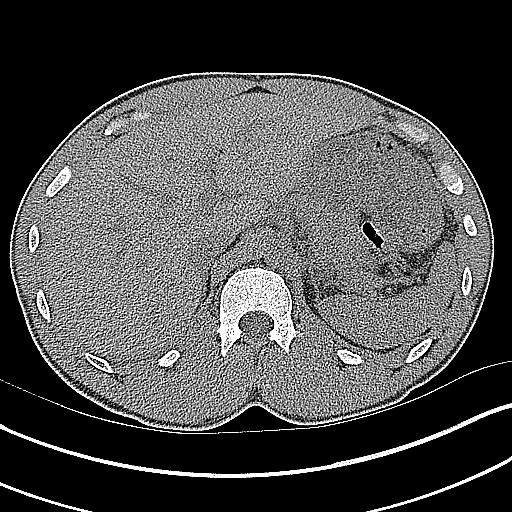
[im 16/46  soft-tissue]
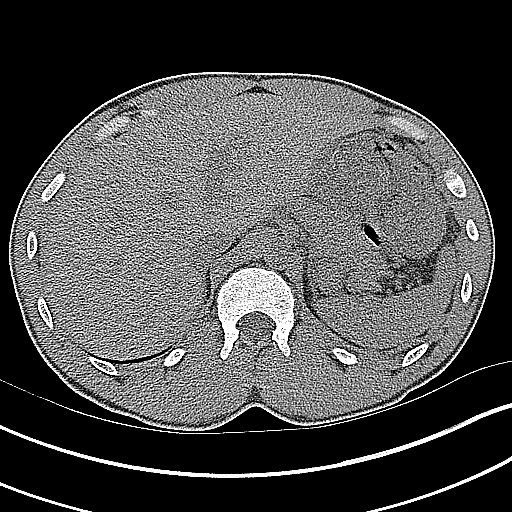
[im 19/46  soft-tissue]
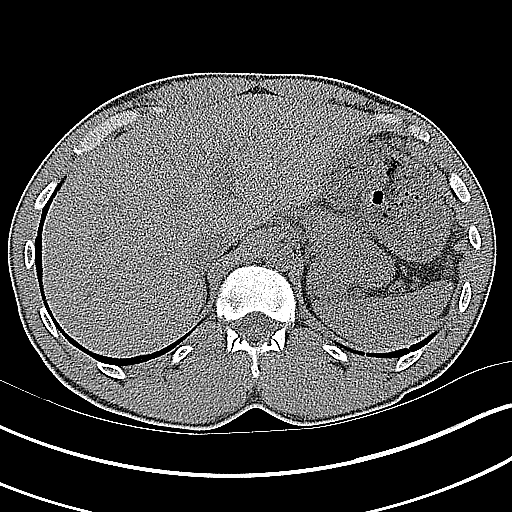
[im 24/46  soft-tissue]
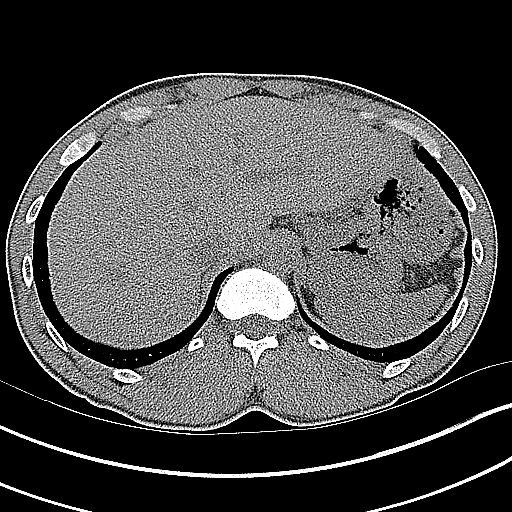
[im 27/46  soft-tissue]
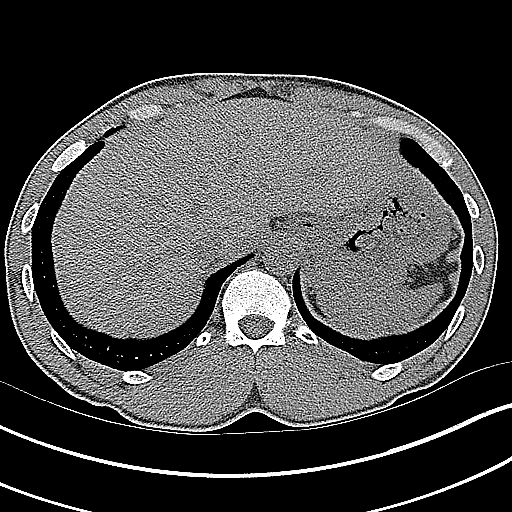
[im 30/46  soft-tissue]
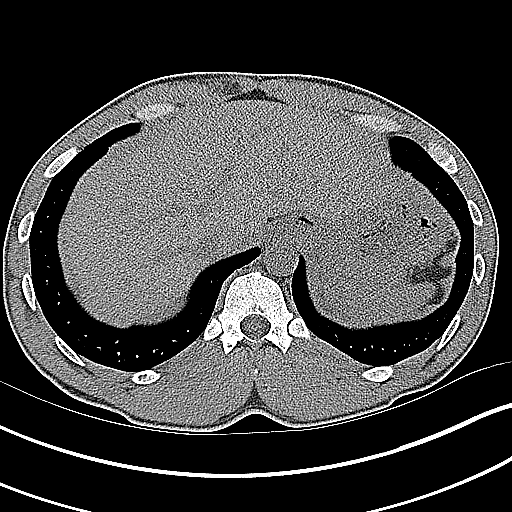
[im 30/46  bone]
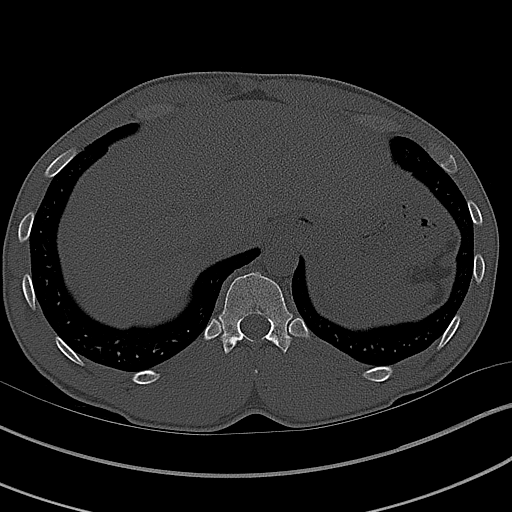
[im 32/46  soft-tissue]
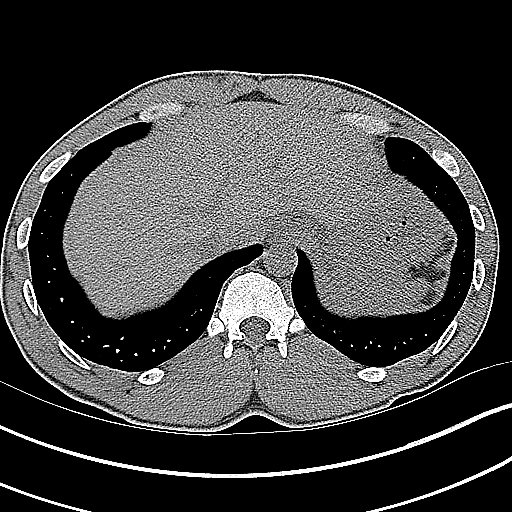
[im 37/46  soft-tissue]
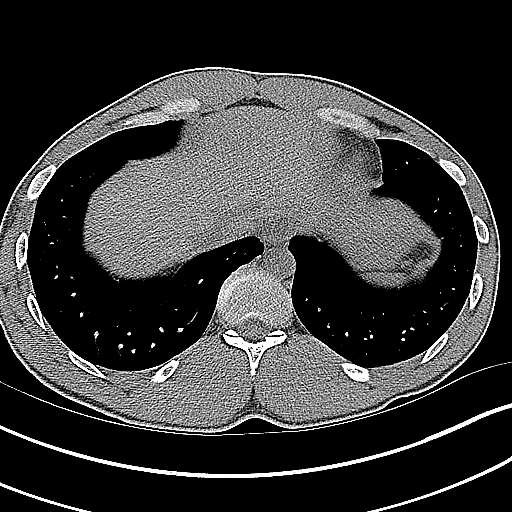
[im 40/46  soft-tissue]
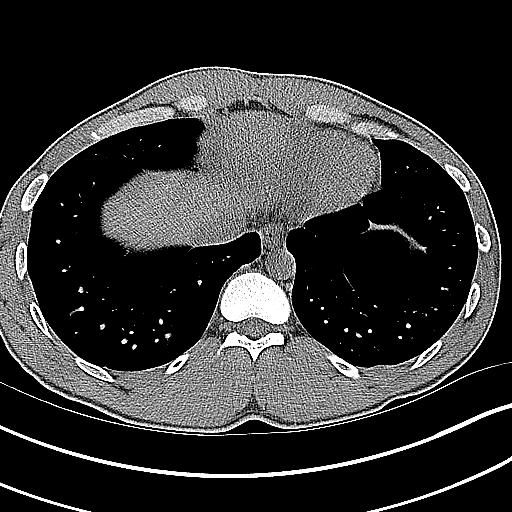
[im 43/46  soft-tissue]
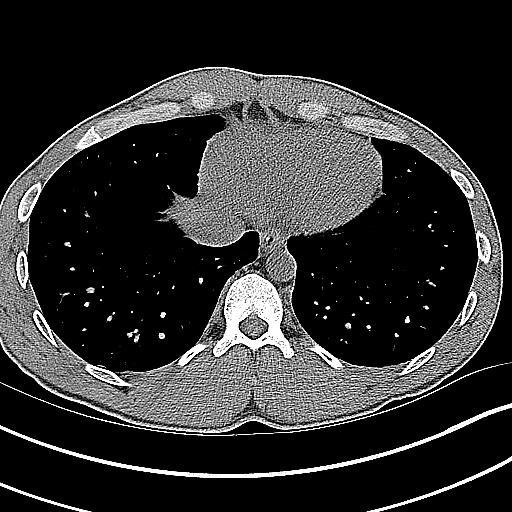

[Series 5: coronal · coronal · 0.70mm/px · 3 of 106 slices shown]
[im 36/106  soft-tissue]
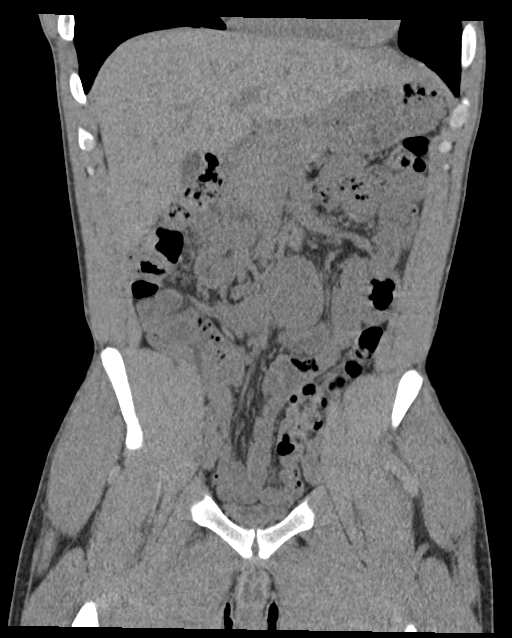
[im 47/106  soft-tissue]
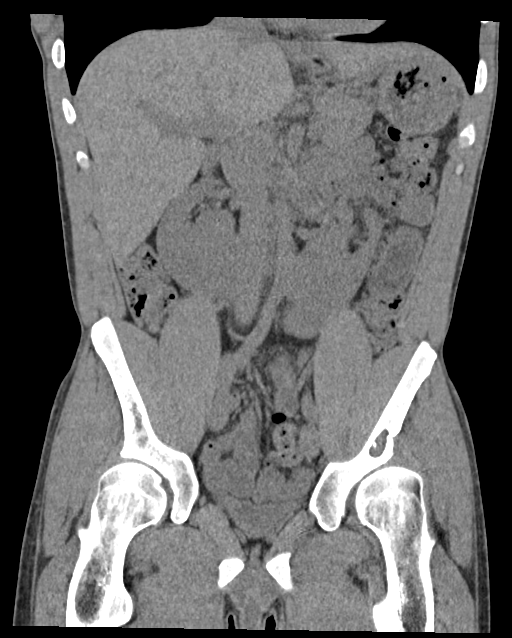
[im 59/106  soft-tissue]
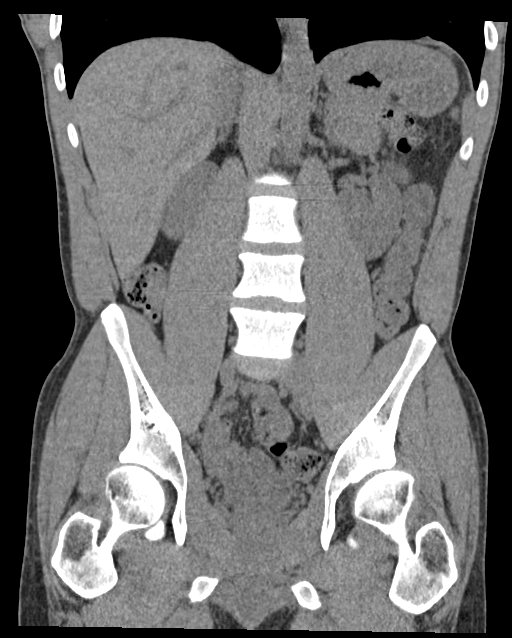

[16 of 46 positions shown; findings below may reference images not displayed]

FINDINGS: Lower chest: Unremarkable.

Hepatobiliary: No definite cystic or solid hepatic lesions are
identified on today's noncontrast CT examination. Unenhanced
appearance of the gallbladder is normal.

Pancreas: No pancreatic mass or peripancreatic inflammatory changes
are noted on today's noncontrast CT examination.

Spleen: Unremarkable.

Adrenals/Urinary Tract: Horseshoe kidney (normal anatomical variant)
incidentally noted. No calcifications are identified in the
collecting systems, along the course of either ureter, or within the
lumen of the urinary bladder. There is no hydroureteronephrosis.
Unenhanced appearance of the urinary bladder is unremarkable.

Stomach/Bowel: Unenhanced appearance of the stomach is normal. There
is no pathologic dilatation of small bowel or colon. Normal
appendix.

Vascular/Lymphatic: No atherosclerotic calcifications or definite
aneurysm identified in the abdominal or pelvic vasculature. No
lymphadenopathy noted in the abdomen or pelvis on today's
noncontrast CT examination.

Reproductive: Prostate gland and seminal vesicles are unremarkable
in appearance.

Other: No significant volume of ascites.  No pneumoperitoneum.

Musculoskeletal: There are no aggressive appearing lytic or blastic
lesions noted in the visualized portions of the skeleton.
IMPRESSION: 1. No acute findings in the abdomen or pelvis. Specifically, no
urinary tract calculi no findings of urinary tract obstruction at
this time.
2. Horseshoe kidney (normal anatomical variant) incidentally noted.
3. Normal appendix.

## 2018-03-13 IMAGING — DX DG HAND COMPLETE 3+V*L*
3 series · 3 of 3 positions shown · non-contrast
Comparison: None available

CLINICAL DATA: Injury, laceration, swelling

EXAM:
LEFT HAND - COMPLETE 3+ VIEW

[hand ap]
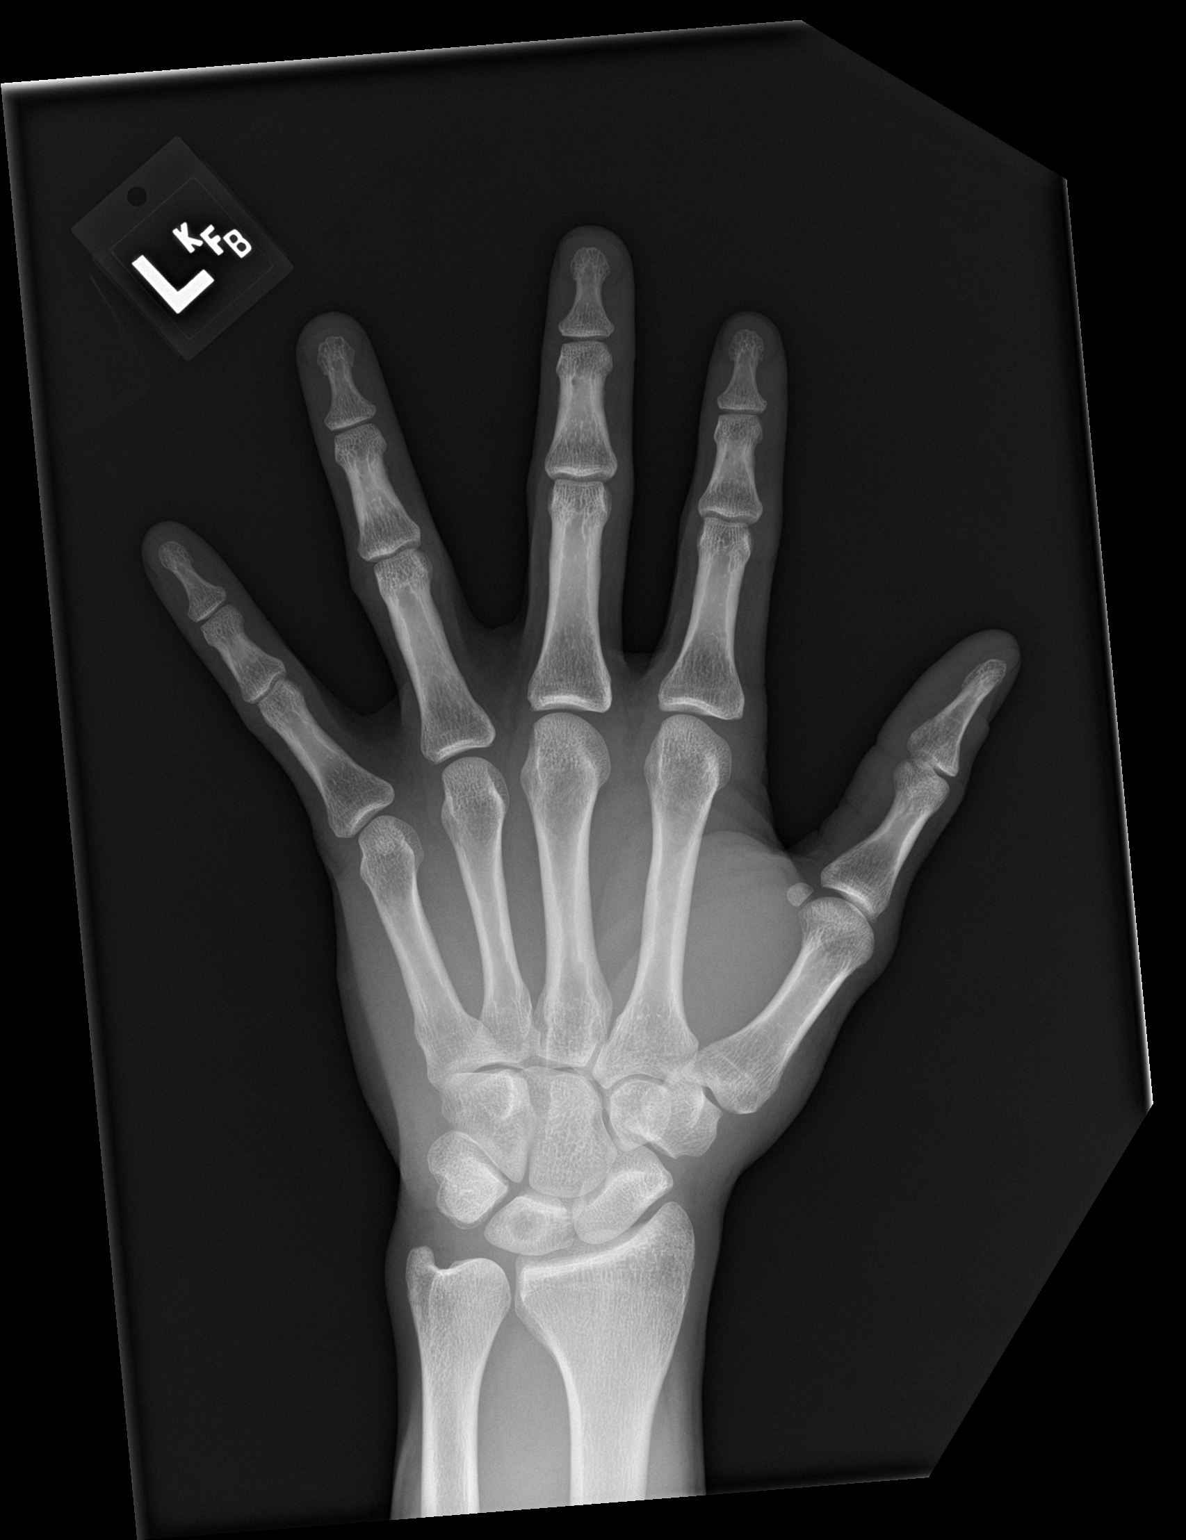

[hand obl]
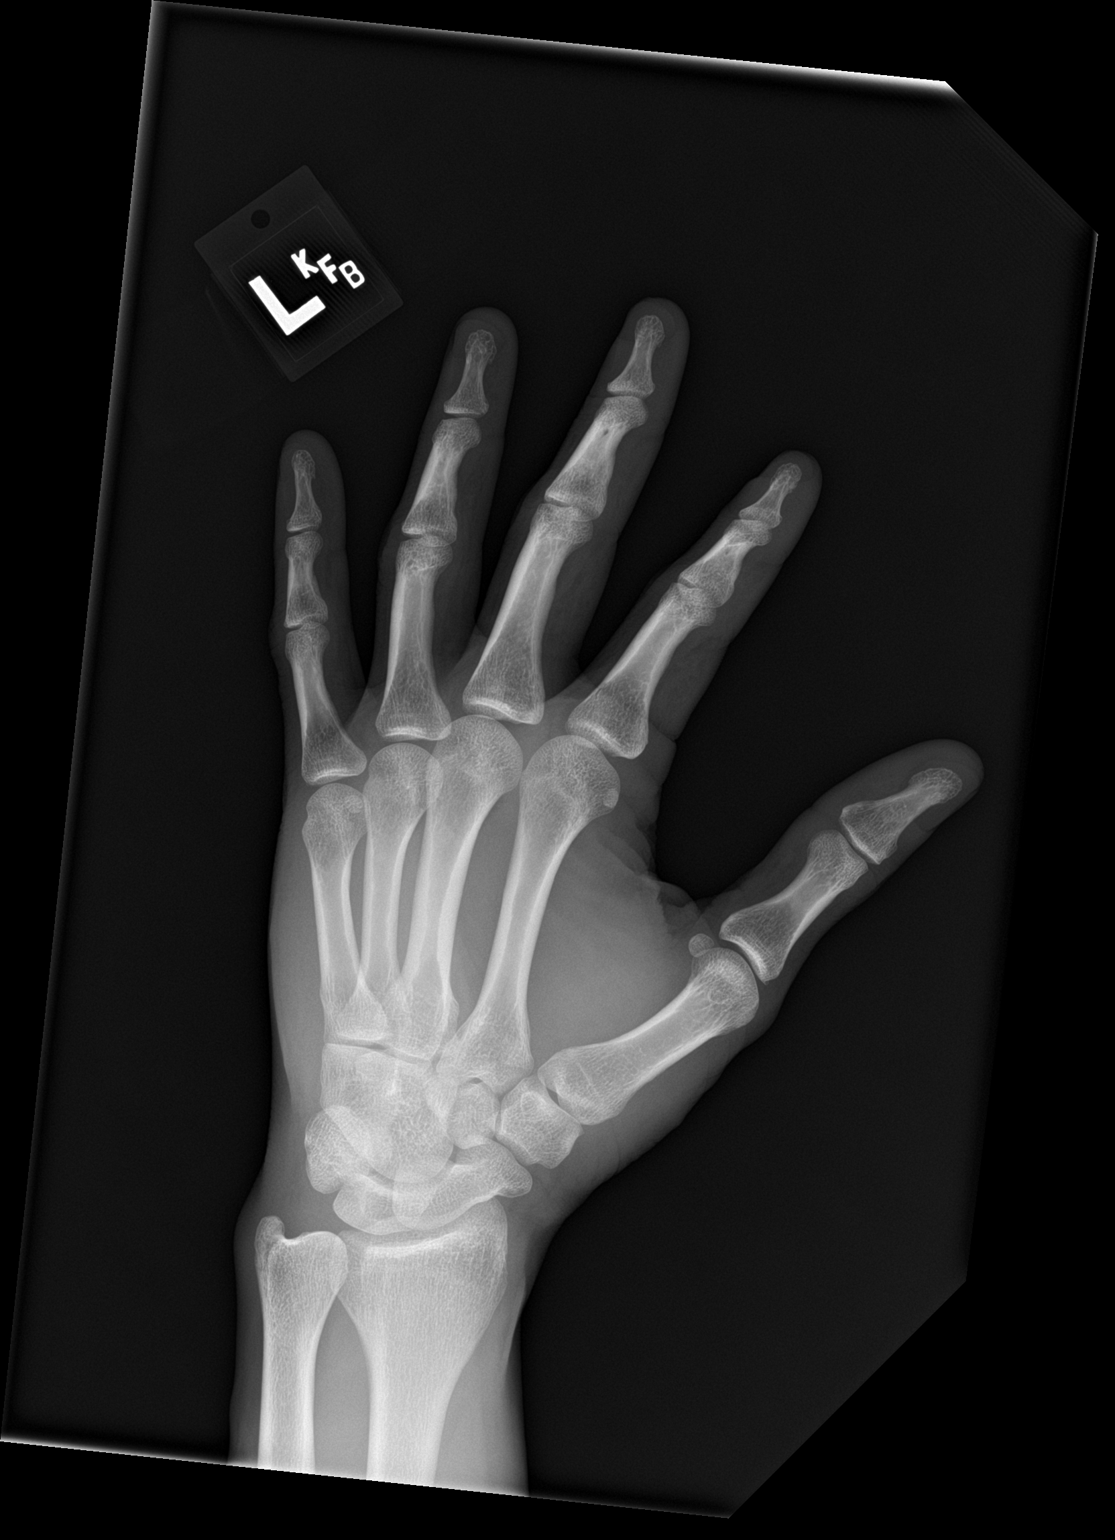

[hand lat]
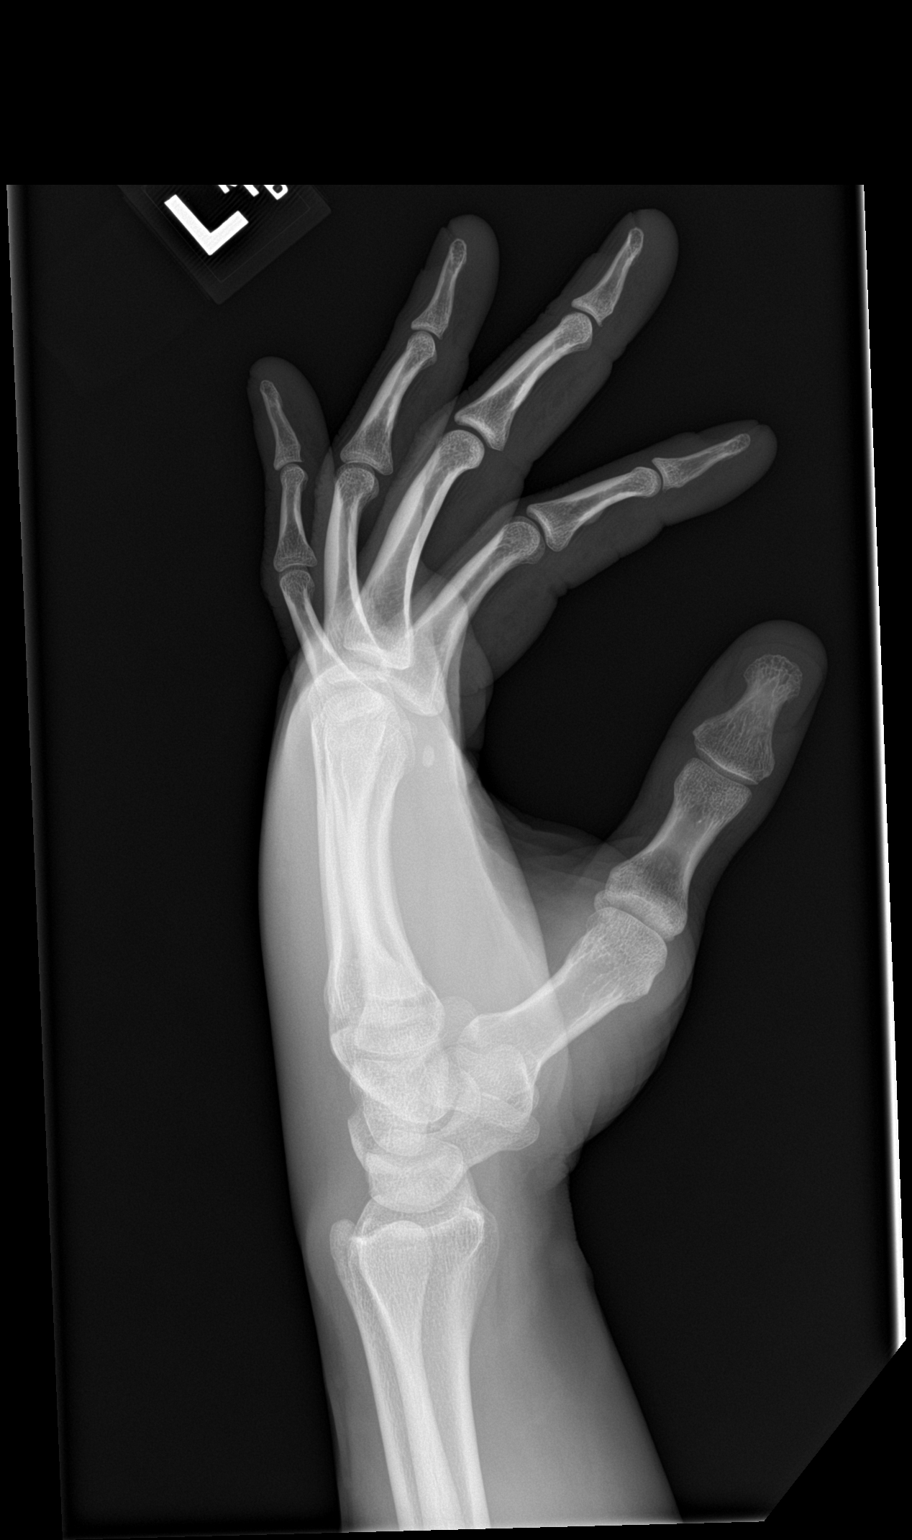

[3 of 3 positions shown; findings below may reference images not displayed]

FINDINGS: There is no evidence of fracture or dislocation. There is no
evidence of arthropathy or other focal bone abnormality. Mild soft
tissue swelling of the dorsal surface on the lateral view. No
radiopaque foreign body.
IMPRESSION: Dorsal hand soft tissue swelling.  No acute osseous finding.

## 2018-10-18 ENCOUNTER — Other Ambulatory Visit: Payer: Self-pay

## 2018-10-18 ENCOUNTER — Emergency Department (HOSPITAL_COMMUNITY)
Admission: EM | Admit: 2018-10-18 | Discharge: 2018-10-19 | Disposition: A | Discharge status: Home / Self Care | Payer: Medicaid - Out of State | Attending: Emergency Medicine | Admitting: Emergency Medicine

## 2018-10-18 ENCOUNTER — Encounter (HOSPITAL_COMMUNITY): Payer: Self-pay

## 2018-10-18 DIAGNOSIS — E876 Hypokalemia: Secondary | ICD-10-CM | POA: Insufficient documentation

## 2018-10-18 DIAGNOSIS — F17210 Nicotine dependence, cigarettes, uncomplicated: Secondary | ICD-10-CM | POA: Insufficient documentation

## 2018-10-18 DIAGNOSIS — F29 Unspecified psychosis not due to a substance or known physiological condition: Secondary | ICD-10-CM

## 2018-10-18 DIAGNOSIS — F319 Bipolar disorder, unspecified: Secondary | ICD-10-CM | POA: Insufficient documentation

## 2018-10-18 DIAGNOSIS — F250 Schizoaffective disorder, bipolar type: Secondary | ICD-10-CM | POA: Insufficient documentation

## 2018-10-18 LAB — MAGNESIUM: Magnesium: 1.8 mg/dL (ref 1.7–2.4)

## 2018-10-18 LAB — RAPID URINE DRUG SCREEN, HOSP PERFORMED
Amphetamines: POSITIVE — AB
Barbiturates: NOT DETECTED
Benzodiazepines: NOT DETECTED
Cocaine: NOT DETECTED
Opiates: NOT DETECTED
Tetrahydrocannabinol: POSITIVE — AB

## 2018-10-18 LAB — COMPREHENSIVE METABOLIC PANEL
ALT: 14 U/L (ref 0–44)
AST: 16 U/L (ref 15–41)
Albumin: 3.9 g/dL (ref 3.5–5.0)
Alkaline Phosphatase: 45 U/L (ref 38–126)
Anion gap: 8 (ref 5–15)
BUN: 8 mg/dL (ref 6–20)
CO2: 23 mmol/L (ref 22–32)
Calcium: 8.8 mg/dL — ABNORMAL LOW (ref 8.9–10.3)
Chloride: 104 mmol/L (ref 98–111)
Creatinine, Ser: 1.21 mg/dL (ref 0.61–1.24)
GFR calc Af Amer: >60 mL/min (ref >60)
GFR calc non Af Amer: >60 mL/min (ref >60)
Glucose, Bld: 117 mg/dL — ABNORMAL HIGH (ref 70–99)
Potassium: 2.8 mmol/L — ABNORMAL LOW (ref 3.5–5.1)
Sodium: 135 mmol/L (ref 135–145)
Total Bilirubin: 1.3 mg/dL — ABNORMAL HIGH (ref 0.3–1.2)
Total Protein: 6.8 g/dL (ref 6.5–8.1)

## 2018-10-18 LAB — CBC WITH DIFFERENTIAL/PLATELET
Abs Immature Granulocytes: 0.02 10*3/uL (ref 0.00–0.07)
Basophils Absolute: 0.1 10*3/uL (ref 0.0–0.1)
Basophils Relative: 1 %
Eosinophils Absolute: 0.1 10*3/uL (ref 0.0–0.5)
Eosinophils Relative: 1 %
HCT: 44.7 % (ref 39.0–52.0)
Hemoglobin: 14.8 g/dL (ref 13.0–17.0)
Immature Granulocytes: 0 %
Lymphocytes Relative: 36 %
Lymphs Abs: 3.4 10*3/uL (ref 0.7–4.0)
MCH: 29 pg (ref 26.0–34.0)
MCHC: 33.1 g/dL (ref 30.0–36.0)
MCV: 87.6 fL (ref 80.0–100.0)
Monocytes Absolute: 0.7 10*3/uL (ref 0.1–1.0)
Monocytes Relative: 8 %
Neutro Abs: 5.1 10*3/uL (ref 1.7–7.7)
Neutrophils Relative %: 54 %
Platelets: 265 10*3/uL (ref 150–400)
RBC: 5.1 MIL/uL (ref 4.22–5.81)
RDW: 13.1 % (ref 11.5–15.5)
WBC: 9.3 10*3/uL (ref 4.0–10.5)
nRBC: 0 % (ref 0.0–0.2)

## 2018-10-18 LAB — SALICYLATE LEVEL: Salicylate Lvl: <7 mg/dL (ref 2.8–30.0)

## 2018-10-18 LAB — ACETAMINOPHEN LEVEL: Acetaminophen (Tylenol), Serum: <10 ug/mL — ABNORMAL LOW (ref 10–30)

## 2018-10-18 LAB — ETHANOL: Alcohol, Ethyl (B): <10 mg/dL (ref <10)

## 2018-10-18 MED ORDER — POTASSIUM CHLORIDE CRYS ER 20 MEQ PO TBCR: 40.0000 meq | EXTENDED_RELEASE_TABLET | Freq: Two times a day (BID) | ORAL | Status: DC

## 2018-10-18 MED ORDER — ZIPRASIDONE MESYLATE 20 MG IM SOLR
10.0000 mg | Freq: Once | INTRAMUSCULAR | Status: DC
  Filled 2018-10-18: qty 20

## 2018-10-18 MED ORDER — ZIPRASIDONE MESYLATE 20 MG IM SOLR
20.0000 mg | Freq: Once | INTRAMUSCULAR | Status: AC
  Administered 2018-10-18: 07:00:00 20 mg via INTRAMUSCULAR

## 2018-10-18 MED ORDER — LORAZEPAM 2 MG/ML IJ SOLN
2.0000 mg | Freq: Once | INTRAMUSCULAR | Status: AC
  Administered 2018-10-18: 07:00:00 2 mg via INTRAMUSCULAR
  Filled 2018-10-18: qty 1

## 2018-10-18 NOTE — BH Assessment (Signed)
TTS attempted to assess the pt, but he is still sedated.  Will see the pt when he is alert.

## 2018-10-18 NOTE — ED Notes (Signed)
Bed: WTR5 Expected date:  Expected time:  Means of arrival:  Comments: 

## 2018-10-18 NOTE — ED Notes (Signed)
Gracyn Shellhorn (mom) [phone].

## 2018-10-18 NOTE — BHH Counselor (Signed)
Shuvon Rankin, NP recommends in patient treatment. Per Mardella Layman, [facility] no appropriate beds at [facility]. TTS to seek placement for patient at an alternate facility. Royce, RN informed of disposition and that he will not be moved to [facility] due to 500 hall being at capacity.

## 2018-10-18 NOTE — ED Notes (Signed)
Dr Elesa Massed spoke to mom and she said that he will escalate really quickly and she wants his phone and he says absolutely not

## 2018-10-18 NOTE — ED Notes (Signed)
Tele Psych in progress 

## 2018-10-18 NOTE — ED Notes (Signed)
MD made aware of patient's refusal to take medication.

## 2018-10-18 NOTE — BH Assessment (Addendum)
Tele Assessment Note   Patient Name: ILAN KEHRES MRN: [id number] Referring Physician: Ward Location of Patient: [facility] ED Location of Provider: Behavioral Health TTS Department  PRATYUSH ALVI is an 38 y.o. male presenting voluntarily to [facility] ED for psychiatric evaluation, complaining of visual hallucinations and paranoia. Patient's mother petitioned IVC upon arrival. Patient appears guarded, paranoid, and irritable during assessment. He reports researching some type of cryo-communication in which a chip is placed under the skin and Coronovirus can be treated that way. Patient reports as he is reading he notices the page says "Fuck you" and he feels someone is intentionally messing with him. Patient also paranoid that injection he received for agitation earlier this date may kill him. He reports the mother of one of his sons is doing it. Patient denies SI/HI. He endorses occasional THC use. Patient admits to a history of depression and anxiety. He states he has 1 prior psychiatric hospitalization when he was a child. Per chart review it was in 2015. Patient denies taking any medications or having any outpatient resources as he recently moved here from [place]. He denies any current criminal charges or history of abuse. Patient gave verbal consent to contact his uncle, Barton Fanny [phone]).  Per patient's uncle, LaWarren: Patient is experiencing increased stress due to not being able to see his 6 children. He states patient is going through some "mental issues" but does not believe patient is a danger to self or others.  Collateral information was also obtained from patient's mother, Bjorn Loser [phone]) whom he lives with who petitioned IVC. She reports patient was initially diagnosed with bipolar disorder at 38 years old and has struggled with mental illness since. She reports patient is paranoid about people being after him, he researches conspiracy theories, thinks the government is listening to  them, and speaks to dead relatives who "tell him things." He refuses medications. He has 6 children, most of whom he is not allowed to see due to his violent behavior. Patient has a history of physical aggression, in particular towards the women he has dated. She reports he uses "anything and everything" in regards to his substance use, other than "injecting himself with a needle."  Patient is alert and oriented x 4. He is dressed in scrubs and laying in hospital bed. Patient's speech is rapid/ aggressive, eye contact is fair, and thoughts are organized, however paranoid in nature. Patient's mood is irritable and affect is congruent. Patient has poor insight, judgement, and impulse control. Patient does not appear to be responding to internal stimuli during assessment, however appears delusional.  Diagnosis: F25.0 Schizoaffective, bipolar type  Past Medical History:  Past Medical History:  Diagnosis Date  . Bipolar 1 disorder, depressed (HCC)     Past Surgical History:  Procedure Laterality Date  . MANDIBLE FRACTURE SURGERY  1996    Family History:  Family History  Problem Relation Age of Onset  . Cancer Mother   . Diabetes Mother   . Hypertension Mother     Social History:  reports that he has been smoking cigarettes. He has a 10.00 pack-year smoking history. He has never used smokeless tobacco. He reports current alcohol use. He reports current drug use. Frequency: 7.00 times per week. Drug: Marijuana.  Additional Social History:  Alcohol / Drug Use Pain Medications: see MAR Prescriptions: see MAR Over the Counter: see MAR History of alcohol / drug use?: Yes Longest period of sobriety (when/how long): "everything besides shooting in his veins"  CIWA: CIWA-Ar  BP: 123/78 Pulse Rate: 69 COWS:    Allergies:  Allergies  Allergen Reactions  . Penicillins     unknown Other reaction(s): Unknown  . Penicillins Other (See Comments)    Unsure of what allergy reaction was     Home Medications: (Not in a hospital admission)   OB/GYN Status:  No LMP for male patient.  General Assessment Data Assessment unable to be completed: Yes Reason for not completing assessment: multiple assessments Location of Assessment: [facility] ED TTS Assessment: In system Is this a Tele or Face-to-Face Assessment?: Tele Assessment Is this an Initial Assessment or a Re-assessment for this encounter?: Initial Assessment Patient Accompanied by:: N/A Language Other than English: No Living Arrangements: Other (Comment)(with mother) What gender do you identify as?: Male Marital status: Single Maiden name: Krone Pregnancy Status: No Living Arrangements: Parent Can pt return to current living arrangement?: Yes Admission Status: Involuntary Petitioner: Family member Is patient capable of signing voluntary admission?: No Referral Source: Self/Family/Friend Insurance type: None     Crisis Care Plan Living Arrangements: Parent Legal Guardian: (self) Name of Psychiatrist: none Name of Therapist: none  Education Status Is patient currently in school?: No Is the patient employed, unemployed or receiving disability?: Unemployed  Risk to self with the past 6 months Suicidal Ideation: No Has patient been a risk to self within the past 6 months prior to admission? : No Suicidal Intent: No Has patient had any suicidal intent within the past 6 months prior to admission? : No Is patient at risk for suicide?: No Suicidal Plan?: No Has patient had any suicidal plan within the past 6 months prior to admission? : No Access to Means: No What has been your use of drugs/alcohol within the last 12 months?: occasional THC use Previous Attempts/Gestures: No How many times?: 0 Other Self Harm Risks: none noted Triggers for Past Attempts: None known Intentional Self Injurious Behavior: None Family Suicide History: No Recent stressful life event(s): Turmoil (Comment)(with family) Persecutory  voices/beliefs?: Yes Depression: Yes Depression Symptoms: Despondent, Insomnia, Isolating, Guilt, Loss of interest in usual pleasures, Feeling angry/irritable Substance abuse history and/or treatment for substance abuse?: No Suicide prevention information given to non-admitted patients: Not applicable  Risk to Others within the past 6 months Homicidal Ideation: No Does patient have any lifetime risk of violence toward others beyond the six months prior to admission? : Yes (comment)(per mother- violent toward females) Thoughts of Harm to Others: No-Not Currently Present/Within Last 6 Months Current Homicidal Intent: No Current Homicidal Plan: No Access to Homicidal Means: No Identified Victim: none History of harm to others?: Yes Assessment of Violence: On admission Violent Behavior Description: abusive mostly toward women; combative in ED Does patient have access to weapons?: No Criminal Charges Pending?: No Does patient have a court date: No Is patient on probation?: No  Psychosis Hallucinations: Auditory, Visual Delusions: Persecutory  Mental Status Report Appearance/Hygiene: In scrubs Eye Contact: Good Motor Activity: Freedom of movement Speech: Aggressive, Pressured Level of Consciousness: Alert Mood: Irritable Affect: Irritable Anxiety Level: Moderate Thought Processes: Circumstantial Judgement: Impaired Orientation: Person, Place, Time, Situation Obsessive Compulsive Thoughts/Behaviors: None  Cognitive Functioning Concentration: Fair Memory: Recent Intact, Remote Intact Is patient IDD: No Insight: Poor Impulse Control: Poor Appetite: Good Have you had any weight changes? : No Change Sleep: Unable to Assess Vegetative Symptoms: None  ADLScreening [facility](BHH Assessment Services) Patient's cognitive ability adequate to safely complete daily activities?: Yes Patient able to express need for assistance with ADLs?: Yes Independently performs ADLs?: Yes (appropriate  for  developmental age)  Prior Inpatient Therapy Prior Inpatient Therapy: Yes Prior Therapy Dates: 2015 Prior Therapy Facilty/Provider(s): [facility] Reason for Treatment: bipolar disorder  Prior Outpatient Therapy Prior Outpatient Therapy: No Does patient have an ACCT team?: No Does patient have Intensive In-House Services?  : No Does patient have [facility] services? : No Does patient have P4CC services?: No  ADL Screening (condition at time of admission) Patient's cognitive ability adequate to safely complete daily activities?: Yes Is the patient deaf or have difficulty hearing?: No Does the patient have difficulty seeing, even when wearing glasses/contacts?: No Does the patient have difficulty concentrating, remembering, or making decisions?: Yes Patient able to express need for assistance with ADLs?: Yes Does the patient have difficulty dressing or bathing?: No Independently performs ADLs?: Yes (appropriate for developmental age) Does the patient have difficulty walking or climbing stairs?: No Weakness of Legs: None Weakness of Arms/Hands: None  Home Assistive Devices/Equipment Home Assistive Devices/Equipment: None  Therapy Consults (therapy consults require a physician order) PT Evaluation Needed: No OT Evalulation Needed: No SLP Evaluation Needed: No Abuse/Neglect Assessment (Assessment to be complete while patient is alone) Abuse/Neglect Assessment Can Be Completed: Unable to assess, patient is non-responsive or altered mental status Values / Beliefs Cultural Requests During Hospitalization: None Spiritual Requests During Hospitalization: None Consults Spiritual Care Consult Needed: No Social Work Consult Needed: No Merchant navy officer (For Healthcare) Does Patient Have a Medical Advance Directive?: No Would patient like information on creating a medical advance directive?: No - Guardian declined          Disposition: Shuvon Rankin, NP recommends in patient  treatment. Disposition Initial Assessment Completed for this Encounter: Yes  This service was provided via telemedicine using a 2-way, interactive audio and video technology.  Names of all persons participating in this telemedicine service and their role in this encounter. Name: Earl Many Role: patient  Name: Celedonio Miyamoto, LCSW Role: TTS  Name:  Role:   Name: Role:     Celedonio Miyamoto 10/18/2018 6:34 PM

## 2018-10-18 NOTE — ED Triage Notes (Signed)
Pt sounds paranoid, admits to having depression Pt is very manic

## 2018-10-18 NOTE — ED Notes (Signed)
Patient unable to give urine sample at this time

## 2018-10-18 NOTE — ED Notes (Signed)
Patient appears restless and fidgety. Provided patient with warm blanket and reassurance. Will continue to monitor patient.

## 2018-10-18 NOTE — ED Triage Notes (Signed)
Pt brought in voluntarily by his mother

## 2018-10-18 NOTE — ED Notes (Signed)
Patient was getting agitated, pacing the room and crying. Orders placed for Ativan, patent started to get verbally and physically aggressive, security called to bedside and assisted patient to ground. Ativan given IM and patient assisted to bed. Hard restraints placed.

## 2018-10-18 NOTE — ED Provider Notes (Addendum)
TIME SEEN: 1:15 AM  CHIEF COMPLAINT: Hallucinations  HPI: Patient is a 38 year old male with history of bipolar disorder, marijuana use who presents to the emergency department with hallucinations.  Patient states that he thinks people are following him and thinks that they are part of again trying to initiate him.  He states that they will pull up on him quickly and then when he turns around to look at them they drive off quickly.  He states that they drive around and flashing their lights at him.  He states that he is seeing things on his cell phone that he knows are not there but repeatedly states "I know I am not crazy".  Patient holds up a sheet of paper in the room that is blank and states that he can see "fuck you" on the paper.  I have spoken to his mother Patrick [name] at [phone]- [phone].  She states that the patient has dealt with mental illness since he was 38 years old.  He is not on medication.  She is not aware of any drug or alcohol use.  She states that he has been talking to his dead family members.  She states that he becomes physically aggressive.  She feels he is a danger to himself and others.  Patient denies any medical complaints.  No pain.  No fevers, cough, vomiting, diarrhea.  ROS: See HPI Constitutional: no fever  Eyes: no drainage  ENT: no runny nose   Cardiovascular:  no chest pain  Resp: no SOB  GI: no vomiting GU: no dysuria Integumentary: no rash  Allergy: no hives  Musculoskeletal: no leg swelling  Neurological: no slurred speech ROS otherwise negative  PAST MEDICAL HISTORY/PAST SURGICAL HISTORY:  Past Medical History:  Diagnosis Date  . Bipolar 1 disorder, depressed (HCC)     MEDICATIONS:  Prior to Admission medications   Not on File    ALLERGIES:  Allergies  Allergen Reactions  . Penicillins     unknown  . Penicillins Other (See Comments)    Unsure of what allergy reaction was    SOCIAL HISTORY:  Social History   Tobacco Use  . Smoking status:  Current Every Day Smoker    Packs/day: 1.00    Years: 10.00    Pack years: 10.00    Types: Cigarettes  . Smokeless tobacco: Never Used  Substance Use Topics  . Alcohol use: Yes    FAMILY HISTORY: Family History  Problem Relation Age of Onset  . Cancer Mother   . Diabetes Mother   . Hypertension Mother     EXAM: BP (!) 137/97 (BP Location: Right Arm)   Pulse 70   Temp 98.5 F (36.9 C) (Oral)   Resp 16   SpO2 99%  CONSTITUTIONAL: Alert and oriented and responds appropriately to questions. Well-appearing; well-nourished HEAD: Normocephalic EYES: Conjunctivae clear, pupils appear equal, EOMI ENT: normal nose; moist mucous membranes NECK: Supple, no meningismus, no nuchal rigidity, no LAD  CARD: RRR; S1 and S2 appreciated; no murmurs, no clicks, no rubs, no gallops RESP: Normal chest excursion without splinting or tachypnea; breath sounds clear and equal bilaterally; no wheezes, no rhonchi, no rales, no hypoxia or respiratory distress, speaking full sentences ABD/GI: Normal bowel sounds; non-distended; soft, non-tender, no rebound, no guarding, no peritoneal signs, no hepatosplenomegaly BACK:  The back appears normal and is non-tender to palpation, there is no CVA tenderness EXT: Normal ROM in all joints; non-tender to palpation; no edema; normal capillary refill; no cyanosis, no calf  tenderness or swelling    SKIN: Normal color for age and race; warm; no rash NEURO: Moves all extremities equally PSYCH: Patient is agitated.  He has rapid speech.  He has poor insight.  Not currently responding to internal stimuli.  He does endorse visual hallucinations.  Denies SI or HI.  MEDICAL DECISION MAKING: Patient here with psychosis.  I agree with patient's mother that he is a danger to himself and others.  He has very poor insight.  He agrees to stay voluntarily at this time but mother states he will escalate quickly and try to leave and become violent.  I have therefore taken out IVC  paperwork.  I feel he would benefit from psychiatric admission.  Will obtain screening labs, urine.  I have updated patient's mother with this plan.  ED PROGRESS: Patient's labs unremarkable other than potassium level of 2.8.  Will give oral replacement, check magnesium level and an EKG.  He is becoming increasingly agitated asking "when I am I can get to talk to someone".  TTS consult is pending.   Magnesium level normal.  Patient very agitated.  Refusing EKG despite being explained why it was indicated with his potassium level being low.    Patient refusing to take his oral potassium.   Patient awaiting psychiatric evaluation.  I feel he needs placement as does his mother.  I feel he is a danger to himself and others due to his acute psychosis.  He is under IVC.   I reviewed all nursing notes, vitals, pertinent previous records, EKGs, lab and urine results, imaging (as available).    Patrick [name], Layla Maw, DO 10/18/18 0626   6:55 AM  Pt now pacing the room, crying.  Seems very agitated.  Stating "what have you all done to me?"  And states "you shocked me"  when nursing staff checking his blood pressure.  Will give IM Ativan.  He has not yet been seen by TTS.   Patrick [name], Layla Maw, DO 10/18/18 5170   7:15 AM  Pt became extremely agitated when nursing staff trying to give him IM Ativan.  He began fighting and trying to elope.  Patient had to be held down by 4 police officer/security.  He was given IM Ativan and IM Geodon.  He is in 4 point restraints and is more calm.     Patrick [name], Layla Maw, DO 10/18/18 [phone]

## 2018-10-18 NOTE — ED Notes (Signed)
Pt speaking with tele-psych.  Pt calm and cooperative at this time.

## 2018-10-18 NOTE — ED Notes (Signed)
[facility] RN stated they are going to place patient in In-Patient facility however ED will most likely be boarding patient for the next 12-14 hours due to capacity at [facility]. Patient is IVC'ed.

## 2018-10-18 NOTE — ED Notes (Signed)
Pt refused EKG. EDP Ward aware of refusal.

## 2018-10-18 NOTE — ED Notes (Signed)
Patient has two belonging bags one with sneakers and another one  with blue jeans, gray tank top, gray tee shirt, and a black hooded.  Patient has a bio plastic bag in the inside of this bag  with a cell phone and a ring.

## 2018-10-19 DIAGNOSIS — F319 Bipolar disorder, unspecified: Secondary | ICD-10-CM

## 2018-10-19 DIAGNOSIS — F29 Unspecified psychosis not due to a substance or known physiological condition: Secondary | ICD-10-CM | POA: Diagnosis present

## 2018-10-19 NOTE — ED Notes (Signed)
Actually 1930 not 0730 for 10/18/2018

## 2018-10-19 NOTE — BH Assessment (Signed)
[facility] Assessment Progress Note  Per Juanetta Beets, DO, this pt does not require psychiatric hospitalization at this time.  Pt presents under IVC initiated by EDP Kristen Ward, DO, which Dr Sharma Covert has rescinded.  Pt is to be discharged from [facility] with recommendation to follow up with [facility].  This has been included in pt's discharge instructions.  Charge nurse Misty Stanley has been notified.  Doylene Canning, MA Triage Specialist [phone]

## 2018-10-19 NOTE — ED Notes (Signed)
Pt has been calm and cooperative

## 2018-10-19 NOTE — Consult Note (Addendum)
[facility] Psych ED Discharge  10/19/2018 11:46 AM Patrick [name]  MRN:  [id number] Principal Problem: Psychosis [facility]) Discharge Diagnoses: Principal Problem:   Psychosis (HCC) Active Problems:   Bipolar 1 disorder (HCC)   Subjective: Pt was seen and chart reviewed with treatment team and Dr Sharma Covert. Pt denies suicidal/homicidal ideation, denies auditory/visual hallucinations and does not appear to be responding to internal stimuli. Pt stated he was seeing "words and sentences" yesterday but he thinks he was just tired and stressed. Pt's mother became concerned and placed him under IVC. Pt stated he lives with his mother and 2 daughters ages 66 & 77. He stated he has depression but has not taken any medications for it since being a child. His UDS was positive for amphetamines and THC, BAL negative. He denies using any amphetamines. He did admit to smoking marijuana occasionally. He stated he had been living in [place] and recently moved back to [facility]. He states he has a lot to live for and his 73 year old son is getting ready to graduate high school. Pt is not currently working. He would like resources for medication management for anxiety. Pt is able to contract for safety upon discharge. His IVC has been rescinded. He will receive outpatient resources for medication management and therapy. Pt is psychiatrically clear.   Total Time spent with patient: 30 minutes  Past Psychiatric History: As above  Past Medical History:  Past Medical History:  Diagnosis Date  . Bipolar 1 disorder, depressed (HCC)     Past Surgical History:  Procedure Laterality Date  . MANDIBLE FRACTURE SURGERY  1996   Family History:  Family History  Problem Relation Age of Onset  . Cancer Mother   . Diabetes Mother   . Hypertension Mother    Family Psychiatric  History: None   Social History:  Social History   Substance and Sexual Activity  Alcohol Use Yes     Social History   Substance and Sexual Activity  Drug Use Yes   . Frequency: 7.0 times per week  . Types: Marijuana   Comment: Denies    Social History   Socioeconomic History  . Marital status: Single    Spouse name: Not on file  . Number of children: Not on file  . Years of education: Not on file  . Highest education level: Not on file  Occupational History  . Not on file  Social Needs  . Financial resource strain: Not on file  . Food insecurity:    Worry: Not on file    Inability: Not on file  . Transportation needs:    Medical: Not on file    Non-medical: Not on file  Tobacco Use  . Smoking status: Current Every Day Smoker    Packs/day: 1.00    Years: 10.00    Pack years: 10.00    Types: Cigarettes  . Smokeless tobacco: Never Used  Substance and Sexual Activity  . Alcohol use: Yes  . Drug use: Yes    Frequency: 7.0 times per week    Types: Marijuana    Comment: Denies  . Sexual activity: Not on file  Lifestyle  . Physical activity:    Days per week: Not on file    Minutes per session: Not on file  . Stress: Not on file  Relationships  . Social connections:    Talks on phone: Not on file    Gets together: Not on file    Attends religious service: Not on  file    Active member of club or organization: Not on file    Attends meetings of clubs or organizations: Not on file    Relationship status: Not on file  Other Topics Concern  . Not on file  Social History Narrative   ** Merged History Encounter **        Has this patient used any form of tobacco in the last 30 days? (Cigarettes, Smokeless Tobacco, Cigars, and/or Pipes) Prescription not provided because: Pt declined  Current Medications: Current Facility-Administered Medications  Medication Dose Route Frequency Provider Last Rate Last Dose  . potassium chloride SA (K-DUR) CR tablet 40 mEq  40 mEq Oral BID Ward, Layla MawKristen N, DO   Stopped at 10/18/18 [id number]   Current Outpatient Medications  Medication Sig Dispense Refill  . Misc Natural Products (ECHINACEA COMPOUND)  LIQD Take 1 drop by mouth daily. In liquid beverage       Musculoskeletal: Strength & Muscle Tone: within normal limits Gait & Station: normal Patient leans: N/A  Psychiatric Specialty Exam: Physical Exam  Constitutional: He is oriented to person, place, and time. He appears well-developed and well-nourished.  HENT:  Head: Normocephalic.  Respiratory: Effort normal.  Musculoskeletal: Normal range of motion.  Neurological: He is alert and oriented to person, place, and time.  Psychiatric: He exhibits a depressed mood.    Review of Systems  Psychiatric/Behavioral: Positive for depression and substance abuse.    Blood pressure 130/80, pulse 90, temperature 98.1 F (36.7 C), temperature source Oral, resp. rate 17, SpO2 98 %.There is no height or weight on file to calculate BMI.  General Appearance: Casual  Eye Contact:  Fair  Speech:  Clear and Coherent and Normal Rate  Volume:  Normal  Mood:  Anxious and Depressed  Affect:  Congruent  Thought Process:  Coherent, Goal Directed and Descriptions of Associations: Intact  Orientation:  Full (Time, Place, and Person)  Thought Content:  Logical  Suicidal Thoughts:  No  Homicidal Thoughts:  No  Memory:  Immediate;   Good Recent;   Good Remote;   Fair  Judgement:  Good  Insight:  Good  Psychomotor Activity:  Normal  Concentration:  Concentration: Good and Attention Span: Good  Recall:  Good  Fund of Knowledge:  Good  Language:  Good  Akathisia:  No  Handed:  Right  AIMS (if indicated):   N/A  Assets:  ArchitectCommunication Skills Financial Resources/Insurance Housing Physical Health Social Support  ADL's:  Intact  Cognition:  WNL  Sleep:   N/A     Demographic Factors:  Male, Low socioeconomic status and Unemployed  Loss Factors: NA  Historical Factors: NA  Risk Reduction Factors:   Responsible for children under 39 years of age and Sense of responsibility to family  Continued Clinical Symptoms:  Depression:    Anhedonia Alcohol/Substance Abuse/Dependencies  Cognitive Features That Contribute To Risk:  None    Suicide Risk:  Minimal: No identifiable suicidal ideation.  Patients presenting with no risk factors but with morbid ruminations; may be classified as minimal risk based on the severity of the depressive symptoms  Follow-up Information    Schedule an appointment as soon as possible for a visit  with [facility].   [facility] Contact information: [address] [place] [id number] [phone]        Your Doctor.   Why:  Please call your doctor for follow up in 2-3 days if not improved  Plan Of Care/Follow-up recommendations:  Activity:  as tolerated Diet:  Heart helathy  Disposition: Depression:  Take all medications as prescribed by your outpatient provider Keep all follow-up appointments as scheduled.  Do not consume alcohol or use illegal drugs while on prescription medications. Report any adverse effects from your medications to your primary care provider promptly.  In the event of recurrent symptoms or worsening symptoms, call 911, a crisis hotline, or go to the nearest emergency department for evaluation.   Laveda Abbe, NP 10/19/2018, 11:46 AM   Patient seen telepsych for psychiatric evaluation, chart reviewed and case discussed with the physician extender and developed treatment plan. Reviewed the information documented and agree with the treatment plan.  Juanetta Beets, DO 10/19/18 12:13 PM

## 2018-10-19 NOTE — Discharge Instructions (Addendum)
For your behavioral health needs, you are advised to follow up with [facility].  Call them at your earliest opportunity to schedule an intake appointment: ° °     [facility] °     [address] °     [place], [place] [address] °     [phone] °

## 2018-12-07 ENCOUNTER — Encounter (HOSPITAL_COMMUNITY): Payer: Self-pay | Admitting: Emergency Medicine

## 2018-12-07 ENCOUNTER — Other Ambulatory Visit: Payer: Self-pay

## 2018-12-07 ENCOUNTER — Emergency Department (HOSPITAL_COMMUNITY)
Admission: EM | Admit: 2018-12-07 | Discharge: 2018-12-07 | Disposition: A | Discharge status: Home / Self Care | Payer: Self-pay | Attending: Emergency Medicine | Admitting: Emergency Medicine

## 2018-12-07 DIAGNOSIS — N5089 Other specified disorders of the male genital organs: Secondary | ICD-10-CM | POA: Insufficient documentation

## 2018-12-07 DIAGNOSIS — F17210 Nicotine dependence, cigarettes, uncomplicated: Secondary | ICD-10-CM | POA: Insufficient documentation

## 2018-12-07 DIAGNOSIS — I889 Nonspecific lymphadenitis, unspecified: Secondary | ICD-10-CM | POA: Insufficient documentation

## 2018-12-07 DIAGNOSIS — F1210 Cannabis abuse, uncomplicated: Secondary | ICD-10-CM | POA: Insufficient documentation

## 2018-12-07 LAB — URINALYSIS, ROUTINE W REFLEX MICROSCOPIC
Bilirubin Urine: NEGATIVE
Glucose, UA: NEGATIVE mg/dL
Hgb urine dipstick: NEGATIVE
Ketones, ur: NEGATIVE mg/dL
Leukocytes,Ua: NEGATIVE
Nitrite: NEGATIVE
Protein, ur: NEGATIVE mg/dL
Specific Gravity, Urine: 1.032 — ABNORMAL HIGH (ref 1.005–1.030)
pH: 5 (ref 5.0–8.0)

## 2018-12-07 MED ORDER — PENICILLIN G BENZATHINE 1200000 UNIT/2ML IM SUSP
2.4000 10*6.[IU] | Freq: Once | INTRAMUSCULAR | Status: AC
  Administered 2018-12-07: 11:00:00 2.4 10*6.[IU] via INTRAMUSCULAR
  Filled 2018-12-07: qty 4

## 2018-12-07 MED ORDER — AZITHROMYCIN 250 MG PO TABS
1000.0000 mg | ORAL_TABLET | Freq: Once | ORAL | Status: AC
  Administered 2018-12-07: 1000 mg via ORAL
  Filled 2018-12-07: qty 4

## 2018-12-07 NOTE — Discharge Instructions (Signed)
You have been treated for an infection of the skin of the genitalia.  Follow-up with the health department for any further testing or treatment.  If any of your cultures that are pending are positive, you will receive a phone call.  Avoid sex for the next 7 days to allow the antibiotics to work.  You can apply antibiotic ointment and cover with gauze.  Return to the emergency department if any concerning signs or symptoms develop such as persistent vomiting, fevers, abdominal pain, pain with bowel movements

## 2018-12-07 NOTE — ED Triage Notes (Addendum)
States has a place on his penis that has gotten bigger , states looks like an eye, denies d/c or dysuria

## 2018-12-07 NOTE — ED Notes (Signed)
Pt verbalized discharge instructions including follow up. Pt ambulatory to lobby with steady gait.

## 2018-12-07 NOTE — ED Provider Notes (Signed)
[facility] EMERGENCY DEPARTMENT Provider Note   CSN: [id number] Arrival date & time: 12/07/18  0849    History   Chief Complaint Chief Complaint  Patient presents with  . Groin Pain    HPI Patrick [name] is a 38 y.o. male with history of bipolar 1 disorder presenting for evaluation of acute onset, progressively worsening area of swelling and tenderness along the penis for 1 week.  He states that the area has become larger and is draining a small amount of fluid. He reports applying peroxide without relief of symptoms.  He reports a constant pain.  No aggravating or alleviating factors noted.  He denies abdominal pain, fever, chills, nausea, vomiting, pain with bowel movements, testicular pain or scrotal swelling, urethral discharge, or urinary symptoms.  He does feel as though he has some inguinal lymphadenopathy bilaterally.  He reports he has not had any new sexual partners since he was last tested approximately 1 year ago but he is requesting STD testing at this time.     The history is provided by the patient.    Past Medical History:  Diagnosis Date  . Bipolar 1 disorder, depressed ([place])     Patient Active Problem List   Diagnosis Date Noted  . Psychosis ([place])   . Suicidal ideation 04/25/2014  . Depression 04/25/2014  . Bipolar 1 disorder ([place]) 04/21/2014  . NECK PAIN 07/15/2010  . BACK PAIN 07/15/2010    Past Surgical History:  Procedure Laterality Date  . [place] Medications    Prior to Admission medications   Medication Sig Start Date End Date Taking? Authorizing Provider  Misc Natural Products [facility] COMPOUND) LIQD Take 1 drop by mouth daily. In liquid beverage    [provider]    Family History Family History  Problem Relation Age of Onset  . Cancer Mother   . Diabetes Mother   . Hypertension Mother     Social History Social History   Tobacco Use  . Smoking status: Current  Every Day Smoker    Packs/day: 1.00    Years: 10.00    Pack years: 10.00    Types: Cigarettes  . Smokeless tobacco: Never Used  Substance Use Topics  . Alcohol use: Yes  . Drug use: Yes    Frequency: 7.0 times per week    Types: Marijuana    Comment: Denies     Allergies   Penicillins and Penicillins   Review of Systems Review of Systems  Constitutional: Negative for chills and fever.  Respiratory: Negative for shortness of breath.   Cardiovascular: Negative for chest pain.  Gastrointestinal: Negative for abdominal pain, nausea and vomiting.  Genitourinary: Positive for genital sores and penile pain. Negative for discharge, dysuria, frequency, penile swelling, scrotal swelling, testicular pain and urgency.  Musculoskeletal: Negative for back pain.  All other systems reviewed and are negative.    Physical Exam Updated Vital Signs BP 117/75 (BP Location: Left Arm)   Pulse 64   Temp 98.4 F (36.9 C)   Resp 12   SpO2 99%   Physical Exam Vitals signs and nursing note reviewed. Exam conducted with a chaperone present.  Constitutional:      General: He is not in acute distress.    Appearance: He is well-developed.  HENT:     Head: Normocephalic and atraumatic.  Eyes:     General:        Right eye:  No discharge.        Left eye: No discharge.     Conjunctiva/sclera: Conjunctivae normal.  Neck:     Vascular: No JVD.     Trachea: No tracheal deviation.  Cardiovascular:     Rate and Rhythm: Normal rate and regular rhythm.  Pulmonary:     Effort: Pulmonary effort is normal.  Abdominal:     General: Bowel sounds are normal. There is no distension.     Palpations: Abdomen is soft.     Tenderness: There is no abdominal tenderness. There is no guarding or rebound.  Genitourinary:    Penis: Lesions present.      Scrotum/Testes: Normal.     Comments: Examination performed in the presence of a chaperone.  There is an ulceration to the dorsum of the penile shaft with  pink base, mild clear drainage.  Tender to palpation.  No drainage from the urethral meatus.  He has left inguinal lymphadenopathy. Lymphadenopathy:     Lower Body: No right inguinal adenopathy. Left inguinal adenopathy present.  Skin:    General: Skin is warm and dry.     Findings: No erythema.  Neurological:     Mental Status: He is alert.  Psychiatric:        Behavior: Behavior normal.        ED Treatments / Results  Labs (all labs ordered are listed, but only abnormal results are displayed) Labs Reviewed  URINALYSIS, ROUTINE W REFLEX MICROSCOPIC - Abnormal; Notable for the following components:      Result Value   Specific Gravity, Urine 1.032 (*)    All other components within normal limits  AEROBIC CULTURE (SUPERFICIAL SPECIMEN)  RPR  HIV ANTIBODY (ROUTINE TESTING W REFLEX)  GC/CHLAMYDIA PROBE AMP ([place]) NOT AT [facility]    EKG None  Radiology No results found.  Procedures Procedures (including critical care time)  Medications Ordered in ED Medications  penicillin g benzathine (BICILLIN LA) 1200000 UNIT/2ML injection 2.4 Million Units (2.4 Million Units Intramuscular Given 12/07/18 1038)  azithromycin (ZITHROMAX) tablet 1,000 mg (1,000 mg Oral Given 12/07/18 1028)     Initial Impression / Assessment and Plan / ED Course  I have reviewed the triage vital signs and the nursing notes.  Pertinent labs & imaging results that were available during my care of the patient were reviewed by me and considered in my medical decision making (see chart for details).        Patient presenting for evaluation of a lesion to the shaft of the penis that has been present for 1 week.  He is afebrile, vital signs are stable.  He is nontoxic in appearance.  No evidence of epididymitis, orchitis, prostatitis, testicular torsion.  The lesion was initially nontender but now is painful with small amount of clear drainage. Differential considerations include primary syphilis,  lymphogranuloma venereum, granuloma inguinale, HSV, chancroid.  We will obtain STD cultures including HIV, syphilis, gonorrhea and chlamydia.  His UA is otherwise unremarkable.  He reports that he has a childhood allergy to penicillins but is unsure of what the reaction is, and states he has been able to tolerate amoxicillin previously.  Spoke with our pharmacist Fleet Contrasachel who states that it would be reasonable to give him a dose of Bicillin IM given our concerns for syphilis and observe for 30 minutes to ensure no reaction.  We will also give 1 g azithromycin, did not require Rocephin given we were giving penicillin.  I do not think he needs antivirals  for coverage of HSV as the lesion does not look consistent with herpes.  On reevaluation patient resting comfortably no apparent distress.  No signs of anaphylaxis, no angioedema, tolerating secretions without difficulty and he is tolerating p.o. intake without difficulty.  He understands that his remaining cultures are pending and he will receive a phone call if they are abnormal.  We discussed strict ED return precautions.  Recommend follow-up with the health department for reevaluation.  Patient verbalized understanding of and agreement with plan and is safe for discharge home at this time.  Final Clinical Impressions(s) / ED Diagnoses   Final diagnoses:  Genital lesion, male  Inguinal lymphadenitis    ED Discharge Orders    None       Bennye AlmFawze, Kamarie Veno A, PA-C 12/07/18 1410    Cathren LaineSteinl, Kevin, MD 12/09/18 1426

## 2018-12-08 LAB — HIV ANTIBODY (ROUTINE TESTING W REFLEX): HIV Screen 4th Generation wRfx: NONREACTIVE

## 2018-12-09 LAB — AEROBIC CULTURE W GRAM STAIN (SUPERFICIAL SPECIMEN)

## 2018-12-09 LAB — AEROBIC CULTURE  (SUPERFICIAL SPECIMEN)

## 2018-12-10 ENCOUNTER — Telehealth: Payer: Self-pay | Admitting: Emergency Medicine

## 2018-12-10 ENCOUNTER — Telehealth (HOSPITAL_COMMUNITY): Payer: Self-pay | Admitting: Pharmacist

## 2018-12-10 NOTE — Progress Notes (Signed)
ED Antimicrobial Stewardship Positive Culture Follow Up   Patrick [name] is an 38 y.o. male who presented to [facility] on (Not on file) with a chief complaint of No chief complaint on file.   Recent Results (from the past 720 hour(s))  Wound or Superficial Culture     Status: None   Collection Time: 12/07/18  9:40 AM   Specimen: Genital; Wound  Result Value Ref Range Status   Specimen Description GENITAL  Final   Special Requests NONE  Final   Gram Stain   Final    RARE WBC PRESENT, PREDOMINANTLY PMN RARE GRAM POSITIVE COCCI Performed at [facility], [address], [place], [place] [address]    Culture   Final    MODERATE METHICILLIN RESISTANT STAPHYLOCOCCUS AUREUS   Report Status 12/09/2018 FINAL  Final   Organism ID, Bacteria METHICILLIN RESISTANT STAPHYLOCOCCUS AUREUS  Final      Susceptibility   Methicillin resistant staphylococcus aureus - MIC*    CIPROFLOXACIN >=8 RESISTANT Resistant     ERYTHROMYCIN >=8 RESISTANT Resistant     GENTAMICIN <=0.5 SENSITIVE Sensitive     OXACILLIN >=4 RESISTANT Resistant     TETRACYCLINE <=1 SENSITIVE Sensitive     VANCOMYCIN 1 SENSITIVE Sensitive     TRIMETH/SULFA <=10 SENSITIVE Sensitive     CLINDAMYCIN <=0.25 SENSITIVE Sensitive     RIFAMPIN <=0.5 SENSITIVE Sensitive     Inducible Clindamycin NEGATIVE Sensitive     * MODERATE METHICILLIN RESISTANT STAPHYLOCOCCUS AUREUS    []  Treated with , organism resistant to prescribed antimicrobial [x]  Patient discharged originally without antimicrobial agent and treatment is now indicated  New antibiotic prescription: Bactrim DS 1 tab po bid x 7 days  ED Provider: S. Lytle Butte 12/10/2018, 10:22 AM Clinical Pharmacist Monday - Friday phone -  [phone] Saturday - Sunday phone - [phone]

## 2018-12-10 NOTE — Telephone Encounter (Signed)
Post ED Visit - Positive Culture Follow-up: Successful Patient Follow-Up  Culture assessed and recommendations reviewed by:  []  Elenor Quinones, Pharm.D. []  Heide Guile, Pharm.D., BCPS AQ-ID []  Parks Neptune, Pharm.D., BCPS []  Alycia Rossetti, Pharm.D., BCPS []  [place], Pharm.D., BCPS, AAHIVP []  Legrand Como, Pharm.D., BCPS, AAHIVP []  Salome Arnt, PharmD, BCPS []  Johnnette Gourd, PharmD, BCPS [x]  Hughes Better, PharmD, BCPS []  Leeroy Cha, PharmD  Positive wound aerobic culture  [x]  Patient discharged without antimicrobial prescription and treatment is now indicated []  Organism is resistant to prescribed ED discharge antimicrobial []  Patient with positive blood cultures  Changes discussed with ED provider: Franchot Heidelberg PA  New antibiotic prescription: Bactrim DA one tab PO BID x seven days Called to [facility] [phone])  Contacted patient, date 12/10/2018, time [place] 12/10/2018, 3:29 PM

## 2018-12-12 LAB — RPR, QUANT+TP ABS (REFLEX)
Rapid Plasma Reagin, Quant: 1:1 {titer} — ABNORMAL HIGH
T Pallidum Abs: REACTIVE — AB

## 2018-12-12 LAB — RPR: RPR Ser Ql: REACTIVE — AB

## 2020-10-18 ENCOUNTER — Ambulatory Visit (HOSPITAL_COMMUNITY)
Admission: EM | Admit: 2020-10-18 | Discharge: 2020-10-18 | Disposition: A | Discharge status: Home / Self Care | Payer: No Payment, Other | Attending: Psychiatry | Admitting: Psychiatry

## 2020-10-18 DIAGNOSIS — F1910 Other psychoactive substance abuse, uncomplicated: Secondary | ICD-10-CM | POA: Insufficient documentation

## 2020-10-18 NOTE — ED Notes (Signed)
Discharge instructions provided and Pt stated understanding. No personal belongings to be returned prior ot d/c from the facility. Pt alert, orient and ambulatory. Pt escorted to the front lobby. Safety maintained.

## 2020-10-18 NOTE — Discharge Instructions (Signed)
Patient is instructed prior to discharge to:  Take all medications as prescribed by his/her mental healthcare provider. Report any adverse effects and or reactions from the medicines to his/her outpatient provider promptly. Keep all scheduled appointments, to ensure that you are getting refills on time and to avoid any interruption in your medication.  If you are unable to keep an appointment call to reschedule.  Be sure to follow-up with resources and follow-up appointments provided.  Patient has been instructed & cautioned: To not engage in alcohol and or illegal drug use while on prescription medicines. In the event of worsening symptoms, patient is instructed to call the crisis hotline, 911 and or go to the nearest ED for appropriate evaluation and treatment of symptoms. To follow-up with his/her primary care provider for your other medical issues, concerns and or health care needs.   Substance abuse resources and Residential Options:  [facility] day residential substance abuse facility (not an option if you have active assault charges). [address], [place], [place] [address] Phone: [phone]: Ask for Shayla in admissions to complete intake if interested in pursuing this option.  [facility]: Can get intake scheduled; (not an option if you have active assault charges). [address] [place], [place] ([phone]) Call Mon-Fri.  [facility]): (offers outpatient therapy and intensive outpatient substance abuse therapy).  [address], [place], [place] [address] Phone: [phone]  [facility]: Offers FREE recovery skills classes, support groups, 1:1 Peer Support, and Compeer Classes. [address], [place], [place] [address] Phone: [phone] (Call to complete intake).   [facility] [address] [place], [place] [address] Phone: [phone] ext [phone]  The [facility] provides food,  shelter and other programs and services to the homeless men of [place] through our men's program.  By offering safe shelter, three meals a day, clean clothing, Biblical counseling, financial planning, vocational training, GED/education and employment assistance, we've helped mend the shattered lives of many homeless men since opening in 1974.  We have approximately 267 beds available, with a max of 312 beds including mats for emergency situations and currently house an average of 270 men a night.  Prospective Client Check-In Information Photo ID Required (State/ Out of State/ DOC) - if photo ID is not available, clients are required to have a printout of a police/sheriff's criminal history report. Help out with chores around the Mission. No sex offender of any type (pending, charged, registered and/or any other sex related offenses) will be permitted to check in. Must be willing to abide by all rules, regulations, and policies established by the [facility]. The following will be provided - shelter, food, clothing, and biblical counseling. If you or someone you know is in need of assistance at our men's shelter in [place], [place], please call [phone] ext. [phone].  
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
## 2021-09-05 ENCOUNTER — Telehealth: Payer: Self-pay
